# Patient Record
Sex: Female | Born: 1950 | Race: White | Hispanic: No | Marital: Married | Smoking: Never smoker
Health system: Southern US, Community
[De-identification: ages and names within clinical notes are randomized; demographics above are authoritative.]

## PROBLEM LIST (undated history)

## (undated) DIAGNOSIS — I341 Nonrheumatic mitral (valve) prolapse: Secondary | ICD-10-CM

## (undated) DIAGNOSIS — E7800 Pure hypercholesterolemia, unspecified: Secondary | ICD-10-CM

## (undated) DIAGNOSIS — K760 Fatty (change of) liver, not elsewhere classified: Secondary | ICD-10-CM

## (undated) DIAGNOSIS — L719 Rosacea, unspecified: Secondary | ICD-10-CM

## (undated) DIAGNOSIS — Z974 Presence of external hearing-aid: Secondary | ICD-10-CM

## (undated) DIAGNOSIS — D696 Thrombocytopenia, unspecified: Secondary | ICD-10-CM

## (undated) DIAGNOSIS — M24619 Ankylosis, unspecified shoulder: Secondary | ICD-10-CM

## (undated) DIAGNOSIS — C801 Malignant (primary) neoplasm, unspecified: Secondary | ICD-10-CM

## (undated) DIAGNOSIS — Z8719 Personal history of other diseases of the digestive system: Secondary | ICD-10-CM

## (undated) DIAGNOSIS — N6019 Diffuse cystic mastopathy of unspecified breast: Secondary | ICD-10-CM

## (undated) DIAGNOSIS — E538 Deficiency of other specified B group vitamins: Secondary | ICD-10-CM

## (undated) DIAGNOSIS — F329 Major depressive disorder, single episode, unspecified: Secondary | ICD-10-CM

## (undated) DIAGNOSIS — F32A Depression, unspecified: Secondary | ICD-10-CM

## (undated) DIAGNOSIS — K219 Gastro-esophageal reflux disease without esophagitis: Secondary | ICD-10-CM

## (undated) DIAGNOSIS — E559 Vitamin D deficiency, unspecified: Secondary | ICD-10-CM

## (undated) DIAGNOSIS — M810 Age-related osteoporosis without current pathological fracture: Secondary | ICD-10-CM

## (undated) HISTORY — DX: Nonrheumatic mitral (valve) prolapse: I34.1

## (undated) HISTORY — PX: COLONOSCOPY: SHX174

## (undated) HISTORY — DX: Gastro-esophageal reflux disease without esophagitis: K21.9

## (undated) HISTORY — PX: APPENDECTOMY: SHX54

## (undated) HISTORY — DX: Personal history of other diseases of the digestive system: Z87.19

## (undated) HISTORY — PX: ESOPHAGOGASTRODUODENOSCOPY: SHX1529

## (undated) HISTORY — DX: Depression, unspecified: F32.A

## (undated) HISTORY — DX: Ankylosis, unspecified shoulder: M24.619

## (undated) HISTORY — DX: Rosacea, unspecified: L71.9

## (undated) HISTORY — PX: HERNIA REPAIR: SHX51

## (undated) HISTORY — PX: SHOULDER SURGERY: SHX246

## (undated) HISTORY — DX: Fatty (change of) liver, not elsewhere classified: K76.0

## (undated) HISTORY — PX: ABDOMINAL HYSTERECTOMY: SHX81

## (undated) HISTORY — DX: Major depressive disorder, single episode, unspecified: F32.9

---

## 2005-02-19 DIAGNOSIS — F411 Generalized anxiety disorder: Secondary | ICD-10-CM | POA: Insufficient documentation

## 2007-01-02 ENCOUNTER — Ambulatory Visit: Payer: Self-pay | Admitting: Internal Medicine

## 2007-07-05 ENCOUNTER — Ambulatory Visit: Payer: Self-pay | Admitting: Obstetrics and Gynecology

## 2008-05-05 ENCOUNTER — Ambulatory Visit: Payer: Self-pay | Admitting: Internal Medicine

## 2009-08-28 ENCOUNTER — Ambulatory Visit: Payer: Self-pay

## 2009-09-01 ENCOUNTER — Ambulatory Visit: Payer: Self-pay

## 2009-10-12 ENCOUNTER — Ambulatory Visit: Payer: Self-pay | Admitting: Family Medicine

## 2010-09-17 ENCOUNTER — Ambulatory Visit: Payer: Self-pay | Admitting: Obstetrics and Gynecology

## 2011-12-15 ENCOUNTER — Ambulatory Visit: Payer: Self-pay | Admitting: Internal Medicine

## 2012-03-13 ENCOUNTER — Ambulatory Visit: Payer: Self-pay | Admitting: Unknown Physician Specialty

## 2012-03-15 LAB — PATHOLOGY REPORT

## 2012-12-15 ENCOUNTER — Ambulatory Visit: Payer: Self-pay | Admitting: Internal Medicine

## 2013-12-19 ENCOUNTER — Ambulatory Visit: Payer: Self-pay | Admitting: Internal Medicine

## 2014-06-11 DIAGNOSIS — E538 Deficiency of other specified B group vitamins: Secondary | ICD-10-CM | POA: Insufficient documentation

## 2014-06-11 DIAGNOSIS — E7800 Pure hypercholesterolemia, unspecified: Secondary | ICD-10-CM | POA: Insufficient documentation

## 2014-06-13 ENCOUNTER — Ambulatory Visit: Payer: Self-pay | Admitting: Internal Medicine

## 2014-07-24 DIAGNOSIS — K8020 Calculus of gallbladder without cholecystitis without obstruction: Secondary | ICD-10-CM | POA: Insufficient documentation

## 2014-07-24 DIAGNOSIS — K2100 Gastro-esophageal reflux disease with esophagitis, without bleeding: Secondary | ICD-10-CM | POA: Insufficient documentation

## 2014-07-24 HISTORY — DX: Gastro-esophageal reflux disease with esophagitis, without bleeding: K21.00

## 2014-08-06 ENCOUNTER — Ambulatory Visit: Payer: Self-pay | Admitting: Unknown Physician Specialty

## 2014-09-16 ENCOUNTER — Ambulatory Visit: Payer: Self-pay | Admitting: Unknown Physician Specialty

## 2014-10-02 ENCOUNTER — Ambulatory Visit: Payer: Self-pay | Admitting: Gastroenterology

## 2014-11-19 ENCOUNTER — Ambulatory Visit
Admit: 2014-11-19 | Disposition: A | Discharge status: Home / Self Care | Payer: Self-pay | Attending: Unknown Physician Specialty | Admitting: Unknown Physician Specialty

## 2014-12-09 LAB — SURGICAL PATHOLOGY

## 2014-12-12 ENCOUNTER — Ambulatory Visit: Admit: 2014-12-12 | Disposition: A | Discharge status: Home / Self Care | Payer: Self-pay | Admitting: Surgery

## 2014-12-12 ENCOUNTER — Ambulatory Visit: Payer: Self-pay

## 2014-12-16 ENCOUNTER — Other Ambulatory Visit: Payer: Self-pay | Admitting: Internal Medicine

## 2014-12-16 DIAGNOSIS — Z1231 Encounter for screening mammogram for malignant neoplasm of breast: Secondary | ICD-10-CM

## 2014-12-16 DIAGNOSIS — Z1239 Encounter for other screening for malignant neoplasm of breast: Secondary | ICD-10-CM

## 2014-12-19 ENCOUNTER — Inpatient Hospital Stay: Payer: BLUE CROSS/BLUE SHIELD | Admitting: Anesthesiology

## 2014-12-19 ENCOUNTER — Other Ambulatory Visit: Payer: Self-pay | Admitting: Surgery

## 2014-12-19 ENCOUNTER — Observation Stay
Admission: RE | Admit: 2014-12-19 | Discharge: 2014-12-21 | Disposition: A | Discharge status: Home / Self Care | Payer: BLUE CROSS/BLUE SHIELD | Source: Ambulatory Visit | Attending: Surgery | Admitting: Surgery

## 2014-12-19 ENCOUNTER — Encounter: Payer: Self-pay | Admitting: *Deleted

## 2014-12-19 ENCOUNTER — Encounter
Admission: RE | Disposition: A | Discharge status: Home / Self Care | Payer: Self-pay | Source: Ambulatory Visit | Attending: Surgery

## 2014-12-19 DIAGNOSIS — Z885 Allergy status to narcotic agent status: Secondary | ICD-10-CM | POA: Insufficient documentation

## 2014-12-19 DIAGNOSIS — Z90710 Acquired absence of both cervix and uterus: Secondary | ICD-10-CM | POA: Diagnosis not present

## 2014-12-19 DIAGNOSIS — Z833 Family history of diabetes mellitus: Secondary | ICD-10-CM | POA: Diagnosis not present

## 2014-12-19 DIAGNOSIS — Z801 Family history of malignant neoplasm of trachea, bronchus and lung: Secondary | ICD-10-CM | POA: Diagnosis not present

## 2014-12-19 DIAGNOSIS — N6019 Diffuse cystic mastopathy of unspecified breast: Secondary | ICD-10-CM | POA: Insufficient documentation

## 2014-12-19 DIAGNOSIS — Z880 Allergy status to penicillin: Secondary | ICD-10-CM | POA: Insufficient documentation

## 2014-12-19 DIAGNOSIS — I341 Nonrheumatic mitral (valve) prolapse: Secondary | ICD-10-CM | POA: Diagnosis not present

## 2014-12-19 DIAGNOSIS — Z8349 Family history of other endocrine, nutritional and metabolic diseases: Secondary | ICD-10-CM | POA: Insufficient documentation

## 2014-12-19 DIAGNOSIS — Z79899 Other long term (current) drug therapy: Secondary | ICD-10-CM | POA: Diagnosis not present

## 2014-12-19 DIAGNOSIS — Z883 Allergy status to other anti-infective agents status: Secondary | ICD-10-CM | POA: Insufficient documentation

## 2014-12-19 DIAGNOSIS — M810 Age-related osteoporosis without current pathological fracture: Secondary | ICD-10-CM | POA: Insufficient documentation

## 2014-12-19 DIAGNOSIS — E538 Deficiency of other specified B group vitamins: Secondary | ICD-10-CM | POA: Diagnosis not present

## 2014-12-19 DIAGNOSIS — Z8249 Family history of ischemic heart disease and other diseases of the circulatory system: Secondary | ICD-10-CM | POA: Insufficient documentation

## 2014-12-19 DIAGNOSIS — F329 Major depressive disorder, single episode, unspecified: Secondary | ICD-10-CM | POA: Insufficient documentation

## 2014-12-19 DIAGNOSIS — Z9889 Other specified postprocedural states: Secondary | ICD-10-CM | POA: Diagnosis not present

## 2014-12-19 DIAGNOSIS — E780 Pure hypercholesterolemia: Secondary | ICD-10-CM | POA: Diagnosis not present

## 2014-12-19 DIAGNOSIS — K449 Diaphragmatic hernia without obstruction or gangrene: Principal | ICD-10-CM | POA: Insufficient documentation

## 2014-12-19 DIAGNOSIS — K219 Gastro-esophageal reflux disease without esophagitis: Secondary | ICD-10-CM | POA: Diagnosis present

## 2014-12-19 DIAGNOSIS — K769 Liver disease, unspecified: Secondary | ICD-10-CM | POA: Diagnosis not present

## 2014-12-19 DIAGNOSIS — Z803 Family history of malignant neoplasm of breast: Secondary | ICD-10-CM | POA: Insufficient documentation

## 2014-12-19 HISTORY — PX: HIATAL HERNIA REPAIR: SHX195

## 2014-12-19 LAB — CREATININE, SERUM: Creatinine, Ser: 0.91 mg/dL (ref 0.44–1.00)

## 2014-12-19 LAB — CBC
HEMATOCRIT: 41.4 % (ref 35.0–47.0)
Hemoglobin: 13.9 g/dL (ref 12.0–16.0)
MCH: 32 pg (ref 26.0–34.0)
MCHC: 33.6 g/dL (ref 32.0–36.0)
MCV: 95.4 fL (ref 80.0–100.0)
Platelets: 140 10*3/uL — ABNORMAL LOW (ref 150–440)
RBC: 4.34 MIL/uL (ref 3.80–5.20)
RDW: 11.9 % (ref 11.5–14.5)
WBC: 11 10*3/uL (ref 3.6–11.0)

## 2014-12-19 SURGERY — REPAIR, HERNIA, HIATAL, LAPAROSCOPIC
Anesthesia: General

## 2014-12-19 MED ORDER — MIDAZOLAM HCL 2 MG/2ML IJ SOLN
INTRAMUSCULAR | Status: DC | PRN
  Administered 2014-12-19: 1 mg via INTRAVENOUS

## 2014-12-19 MED ORDER — HYDROCODONE-ACETAMINOPHEN 5-325 MG PO TABS
1.0000 | ORAL_TABLET | ORAL | Status: DC | PRN
  Administered 2014-12-19 – 2014-12-21 (×8): 2 via ORAL
  Filled 2014-12-19: qty 1
  Filled 2014-12-19 (×3): qty 2
  Filled 2014-12-19: qty 1
  Filled 2014-12-19 (×4): qty 2

## 2014-12-19 MED ORDER — PANTOPRAZOLE SODIUM 40 MG PO TBEC
40.0000 mg | DELAYED_RELEASE_TABLET | Freq: Two times a day (BID) | ORAL | Status: DC
  Administered 2014-12-19 – 2014-12-21 (×5): 40 mg via ORAL
  Filled 2014-12-19 (×5): qty 1

## 2014-12-19 MED ORDER — SUCCINYLCHOLINE CHLORIDE 20 MG/ML IJ SOLN
INTRAMUSCULAR | Status: DC | PRN
  Administered 2014-12-19: 90 mg via INTRAVENOUS

## 2014-12-19 MED ORDER — BUPIVACAINE-EPINEPHRINE (PF) 0.5% -1:200000 IJ SOLN
INTRAMUSCULAR | Status: AC
  Filled 2014-12-19: qty 30

## 2014-12-19 MED ORDER — ACETAMINOPHEN 10 MG/ML IV SOLN
INTRAVENOUS | Status: AC
  Administered 2014-12-19: 1000 mg via INTRAVENOUS
  Filled 2014-12-19: qty 100

## 2014-12-19 MED ORDER — HEPARIN SODIUM (PORCINE) 5000 UNIT/ML IJ SOLN
INTRAMUSCULAR | Status: AC
  Filled 2014-12-19: qty 1

## 2014-12-19 MED ORDER — DIPHENHYDRAMINE HCL 50 MG/ML IJ SOLN
INTRAMUSCULAR | Status: DC | PRN
  Administered 2014-12-19: 10 mg via INTRAVENOUS

## 2014-12-19 MED ORDER — ONDANSETRON HCL 4 MG/2ML IJ SOLN
INTRAMUSCULAR | Status: DC | PRN
  Administered 2014-12-19: 4 mg via INTRAVENOUS

## 2014-12-19 MED ORDER — DEXAMETHASONE SODIUM PHOSPHATE 4 MG/ML IJ SOLN
INTRAMUSCULAR | Status: DC | PRN
  Administered 2014-12-19: 5 mg via INTRAVENOUS

## 2014-12-19 MED ORDER — ROCURONIUM BROMIDE 100 MG/10ML IV SOLN
INTRAVENOUS | Status: DC | PRN
  Administered 2014-12-19 (×2): 10 mg via INTRAVENOUS
  Administered 2014-12-19: 40 mg via INTRAVENOUS
  Administered 2014-12-19 (×2): 10 mg via INTRAVENOUS

## 2014-12-19 MED ORDER — BUPIVACAINE-EPINEPHRINE 0.5% -1:200000 IJ SOLN
INTRAMUSCULAR | Status: DC | PRN
  Administered 2014-12-19: 23 mL

## 2014-12-19 MED ORDER — PROPOFOL 10 MG/ML IV BOLUS
INTRAVENOUS | Status: DC | PRN
  Administered 2014-12-19: 20 mg via INTRAVENOUS
  Administered 2014-12-19: 140 mg via INTRAVENOUS

## 2014-12-19 MED ORDER — LIDOCAINE HCL (CARDIAC) 20 MG/ML IV SOLN
INTRAVENOUS | Status: DC | PRN
  Administered 2014-12-19: 100 mg via INTRAVENOUS

## 2014-12-19 MED ORDER — FENTANYL CITRATE (PF) 100 MCG/2ML IJ SOLN
INTRAMUSCULAR | Status: DC | PRN
  Administered 2014-12-19: 100 ug via INTRAVENOUS
  Administered 2014-12-19 (×2): 50 ug via INTRAVENOUS
  Administered 2014-12-19 (×2): 100 ug via INTRAVENOUS
  Administered 2014-12-19 (×2): 50 ug via INTRAVENOUS

## 2014-12-19 MED ORDER — HYDROMORPHONE HCL 1 MG/ML IJ SOLN
INTRAMUSCULAR | Status: DC | PRN
  Administered 2014-12-19: 1 mg via INTRAVENOUS

## 2014-12-19 MED ORDER — PHENYLEPHRINE HCL 10 MG/ML IJ SOLN
INTRAMUSCULAR | Status: DC | PRN
  Administered 2014-12-19 (×2): 150 ug via INTRAVENOUS

## 2014-12-19 MED ORDER — CEFAZOLIN SODIUM 1-5 GM-% IV SOLN
INTRAVENOUS | Status: DC | PRN
  Administered 2014-12-19: .99 g via INTRAVENOUS
  Administered 2014-12-19: .01 g via INTRAVENOUS

## 2014-12-19 MED ORDER — NEOSTIGMINE METHYLSULFATE 10 MG/10ML IV SOLN
INTRAVENOUS | Status: DC | PRN
  Administered 2014-12-19: 4 mg via INTRAVENOUS

## 2014-12-19 MED ORDER — FENTANYL CITRATE (PF) 100 MCG/2ML IJ SOLN: 25.0000 ug | INTRAMUSCULAR | Status: DC | PRN

## 2014-12-19 MED ORDER — HEPARIN SODIUM (PORCINE) 5000 UNIT/ML IJ SOLN
5000.0000 [IU] | Freq: Two times a day (BID) | INTRAMUSCULAR | Status: DC
  Administered 2014-12-19 – 2014-12-21 (×4): 5000 [IU] via SUBCUTANEOUS
  Filled 2014-12-19 (×4): qty 1

## 2014-12-19 MED ORDER — GLYCOPYRROLATE 0.2 MG/ML IJ SOLN
INTRAMUSCULAR | Status: DC | PRN
  Administered 2014-12-19: 0.6 mg via INTRAVENOUS

## 2014-12-19 MED ORDER — ONDANSETRON HCL 4 MG/2ML IJ SOLN
4.0000 mg | Freq: Four times a day (QID) | INTRAMUSCULAR | Status: DC | PRN
  Administered 2014-12-19: 4 mg via INTRAVENOUS
  Filled 2014-12-19: qty 2

## 2014-12-19 MED ORDER — LACTATED RINGERS IV SOLN
INTRAVENOUS | Status: DC
  Administered 2014-12-19 (×4): via INTRAVENOUS

## 2014-12-19 MED ORDER — ONDANSETRON HCL 4 MG/2ML IJ SOLN
4.0000 mg | Freq: Once | INTRAMUSCULAR | Status: DC | PRN
Start: 2014-12-19 — End: 2014-12-19

## 2014-12-19 MED ORDER — DEXTROSE-NACL 5-0.2 % IV SOLN
INTRAVENOUS | Status: DC
  Administered 2014-12-19 – 2014-12-20 (×3): via INTRAVENOUS

## 2014-12-19 MED ORDER — MORPHINE SULFATE 2 MG/ML IJ SOLN
1.0000 mg | INTRAMUSCULAR | Status: DC | PRN
  Administered 2014-12-19: 2 mg via INTRAVENOUS
  Filled 2014-12-19: qty 1

## 2014-12-19 SURGICAL SUPPLY — 45 items
APL SKNCLS STERI-STRIP NONHPOA (GAUZE/BANDAGES/DRESSINGS) ×1
APPLIER CLIP ROT 10 11.4 M/L (STAPLE)
APR CLP MED LRG 11.4X10 (STAPLE)
BENZOIN TINCTURE PRP APPL 2/3 (GAUZE/BANDAGES/DRESSINGS) ×2 IMPLANT
CANISTER SUCT 1200ML W/VALVE (MISCELLANEOUS) ×3 IMPLANT
CANNULA DILATOR 10 W/SLV (CANNULA) ×2 IMPLANT
CANNULA DILATOR 10MM W/SLV (CANNULA) ×1
CATH TRAY 16F METER LATEX (MISCELLANEOUS) ×1 IMPLANT
CHLORAPREP W/TINT 26ML (MISCELLANEOUS) ×3 IMPLANT
CLIP APPLIE ROT 10 11.4 M/L (STAPLE) ×1 IMPLANT
CLOSURE WOUND 1/2 X4 (GAUZE/BANDAGES/DRESSINGS) ×1
DEVICE SUTURE ENDOST 10MM (ENDOMECHANICALS) ×2 IMPLANT
DISSECTOR KITTNER STICK (MISCELLANEOUS) ×2 IMPLANT
DISSECTORS/KITTNER STICK (MISCELLANEOUS) ×6
GAUZE SPONGE 4X4 12PLY STRL (GAUZE/BANDAGES/DRESSINGS) ×3 IMPLANT
GLOVE BIO SURGEON STRL SZ7.5 (GLOVE) ×11 IMPLANT
GOWN STRL REUS W/ TWL LRG LVL3 (GOWN DISPOSABLE) ×2 IMPLANT
GOWN STRL REUS W/TWL LRG LVL3 (GOWN DISPOSABLE) ×9
IRRIGATION STRYKERFLOW (MISCELLANEOUS) ×1 IMPLANT
IRRIGATOR STRYKERFLOW (MISCELLANEOUS) ×3
IV NS 1000ML (IV SOLUTION)
IV NS 1000ML BAXH (IV SOLUTION) ×1 IMPLANT
KIT RM TURNOVER STRD PROC AR (KITS) ×3 IMPLANT
LABEL OR SOLS (LABEL) ×1 IMPLANT
LIQUID BAND (GAUZE/BANDAGES/DRESSINGS) ×1 IMPLANT
NDL HYPO 25X1 1.5 SAFETY (NEEDLE) IMPLANT
NDL INSUFF ACCESS 14 VERSASTEP (NEEDLE) ×3 IMPLANT
NEEDLE HYPO 25X1 1.5 SAFETY (NEEDLE) ×3 IMPLANT
NS IRRIG 500ML POUR BTL (IV SOLUTION) ×3 IMPLANT
PACK LAP CHOLECYSTECTOMY (MISCELLANEOUS) ×3 IMPLANT
PAD GROUND ADULT SPLIT (MISCELLANEOUS) ×3 IMPLANT
RETRACT II ENDO 10MM 32CML (ENDOMECHANICALS) ×3
RETRACTOR II ENDO 10MM 32CML (ENDOMECHANICALS) ×1 IMPLANT
SCISSORS METZENBAUM CVD 33 (INSTRUMENTS) ×3 IMPLANT
SEAL FOR SCOPE WARMER C3101 (MISCELLANEOUS) ×1 IMPLANT
SHEARS HARMONIC ACE PLUS 36CM (ENDOMECHANICALS) ×3 IMPLANT
STRIP CLOSURE SKIN 1/2X4 (GAUZE/BANDAGES/DRESSINGS) ×2 IMPLANT
SUT CHROMIC 4 0 RB 1X27 (SUTURE) ×5 IMPLANT
SUT ENDO SURGIDAC 0 7  GRN ES9 (SUTURE) ×22
SUT ENDO SURGIDAC 0 7 GRN ES9 (SUTURE) ×11
SUTURE ENDO SURGDC 0 7 GRN ES9 (SUTURE) ×8 IMPLANT
TROCAR XCEL NON-BLD 11X100MML (ENDOMECHANICALS) ×3 IMPLANT
TROCAR XCEL UNIV SLVE 11M 100M (ENDOMECHANICALS) ×12 IMPLANT
TUBING INSUFFLATOR HEATED (MISCELLANEOUS) ×3 IMPLANT
WATER STERILE IRR 1000ML POUR (IV SOLUTION) ×1 IMPLANT

## 2014-12-19 NOTE — Transfer of Care (Signed)
Immediate Anesthesia Transfer of Care Note  Patient: Jody Allen  Procedure(s) Performed: Procedure(s): LAPAROSCOPIC REPAIR OF HIATAL HERNIA (N/A)  Patient Location: PACU  Anesthesia Type:General  Level of Consciousness: awake, alert  and oriented  Airway & Oxygen Therapy: Patient Spontanous Breathing and Patient connected to nasal cannula oxygen  Post-op Assessment: Report given to RN and Post -op Vital signs reviewed and stable  Post vital signs: Reviewed and stable  Last Vitals:  Filed Vitals:   12/19/14 0820  BP: 148/57  Pulse: 73  Temp: 36.9 C  Resp: 14    Complications: No apparent anesthesia complications

## 2014-12-19 NOTE — Anesthesia Postprocedure Evaluation (Signed)
  Anesthesia Post-op Note  Patient: Jody Allen  Procedure(s) Performed: Procedure(s): LAPAROSCOPIC REPAIR OF HIATAL HERNIA (N/A)  Anesthesia type:General  Patient location: PACU  Post pain: Pain level controlled  Post assessment: Post-op Vital signs reviewed, Patient's Cardiovascular Status Stable, Respiratory Function Stable, Patent Airway and No signs of Nausea or vomiting  Post vital signs: Reviewed and stable  Last Vitals:  Filed Vitals:   12/19/14 1338  BP:   Pulse:   Temp: 36.4 C  Resp:     Level of consciousness: awake, alert  and patient cooperative  Complications: No apparent anesthesia complications

## 2014-12-19 NOTE — Progress Notes (Signed)
She reports moderate epigastric discomfort after surgery. She has taken 2 Norco tablets. Been drinking some water. She has walked to the bathroom and passed a small amount of urine.  Vital signs are stable. She is awake alert and oriented. Dressings are dry. Minimal abdominal tenderness.  I discussed the operation and plan of care.

## 2014-12-19 NOTE — Anesthesia Preprocedure Evaluation (Signed)
Anesthesia Evaluation  Patient identified by MRN, date of birth, ID band Patient awake    History of Anesthesia Complications Negative for: history of anesthetic complications  Airway Mallampati: III       Dental  (+) Caps   Pulmonary neg pulmonary ROS,    Pulmonary exam normal       Cardiovascular Normal cardiovascular exam    Neuro/Psych Depression negative neurological ROS     GI/Hepatic hiatal hernia, GERD-  ,  Endo/Other  negative endocrine ROS  Renal/GU negative Renal ROS  negative genitourinary   Musculoskeletal negative musculoskeletal ROS (+)   Abdominal Normal abdominal exam  (+)   Peds negative pediatric ROS (+)  Hematology negative hematology ROS (+)   Anesthesia Other Findings   Reproductive/Obstetrics negative OB ROS                             Anesthesia Physical Anesthesia Plan  ASA: II  Anesthesia Plan: General   Post-op Pain Management:    Induction: Intravenous  Airway Management Planned: Oral ETT  Additional Equipment:   Intra-op Plan:   Post-operative Plan: Extubation in OR  Informed Consent: I have reviewed the patients History and Physical, chart, labs and discussed the procedure including the risks, benefits and alternatives for the proposed anesthesia with the patient or authorized representative who has indicated his/her understanding and acceptance.     Plan Discussed with: CRNA and Surgeon  Anesthesia Plan Comments:         Anesthesia Quick Evaluation

## 2014-12-19 NOTE — Op Note (Signed)
Preoperative diagnosis hiatus hernia with chronic gastroesophageal reflux  Postoperative diagnosis same  Procedure laparoscopic hiatus hernia repair with fundoplication  Anesthesia Gen.  Surgeon Rochel Brome  Indications this 64 year old female has a 10 year history of chronic gastroesophageal reflux and has not made satisfactory progress with medical management and surgery was recommended for definitive treatment.  The patient was placed on the operating table in the supine position under general endotracheal anesthesia. The abdomen was prepared with ChloraPrep and draped in a sterile manner  A short incision was made in the epigastrium just about 1 inch above the umbilicus and carried down to the deep fascia which was grasped with a laryngeal hook. A Veress needle was inserted aspirated and irrigated with saline solution. Abdomen was insufflated with carbon dioxide. The Veress needle was removed. The 12 mm cannula was inserted. The 10 mm 25 laparoscope was inserted to view the peritoneal cavity. There was an appearance of a fatty liver. There was some mild distention of the stomach and I had the anesthetist insert an oral gastric tube for drainage of the stomach. An 11 mm trocar was inserted in the subxiphoid position. The next trocar was inserted in the left upper quadrant. The next trocar was inserted in the right upper quadrant just below the costal margin and another midway between that and the camera site. The sixth trocar was inserted in the right upper quadrant at the anterior axillary line.  With the patient in the reverse Trendelenburg position and the 5 finger fan retractor was introduced through the subxiphoid port to retract the left lobe of the liver and held in place with the Buchwalter mechanical arm. The stomach was grasped with a Babcock clamp and traction applied. The gastrohepatic ligament was incised with the harmonic scalpel. Dissection of the peritoneum over the esophagus was  carried out with the Harmonic scalpel. The right crus of the diagram was identified. With blunt dissection the left crus was identified. Blunt dissection was carried out posterior to the esophagus to create a window which was some 3 cm in dimension which could be triangulated. The hiatus hernia repaired was carried out with a roll of 0 Surgidac sutures using the Endo Stitch instrument. there was some minimal bleeding which was aspirated and resolved. It appeared that the repair may have been her that needed and therefore 2 sutures were removed and replaced with a single 0 Surgidac figure-of-eight suture A floppy portion of the stomach was selected for the fundoplication and was passed from left to right posterior to the esophagus with a Babcock clamp. The fundoplication was carried out with 0 Surgidac sutures placing 4 sutures. The wrap was satisfactorily floppy in that it was not tight as planned. Hemostasis appeared to be intact  The laparoscopic cannulas were removed. There was some bleeding from the right upper quadrant port site in the midclavicular line and this site was infiltrated with half percent Sensorcaine with epinephrine. Other incisions were infiltrated as well also subcutaneous tissues were infiltrated. We appeared to be intact and all the cannulas were removed. The skin incisions were closed with interrupted 4-0 chromic subcuticular suture benzoin and Steri-Strips. dressings were applied with paper tape

## 2014-12-19 NOTE — H&P (Signed)
  She had recent bronchitis and took antibiotic and is better.  Lungs sound clear.  No other change in condition.  12-19-2014

## 2014-12-19 NOTE — Anesthesia Procedure Notes (Signed)
Date/Time: 12/19/2014 10:09 AM Performed by: Rosaria Ferries, Tacori Kvamme Oxygen Delivery Method: Circle system utilized Preoxygenation: Pre-oxygenation with 100% oxygen Intubation Type: IV induction Laryngoscope Size: Mac and 3 Grade View: Grade I Tube type: Oral Tube size: 7.0 mm Number of attempts: 1 (thyroid pressure rerquired ) Placement Confirmation: ETT inserted through vocal cords under direct vision and breath sounds checked- equal and bilateral Secured at: 21 cm Tube secured with: Tape Dental Injury: Teeth and Oropharynx as per pre-operative assessment

## 2014-12-19 NOTE — OR Nursing (Signed)
The patient verified she is allergic to penicillin, but Dr. Tamala Julian ordered 1gm of Kefzol to be given before the start of the case.

## 2014-12-20 DIAGNOSIS — K449 Diaphragmatic hernia without obstruction or gangrene: Secondary | ICD-10-CM | POA: Diagnosis not present

## 2014-12-20 MED ORDER — DIAZEPAM 5 MG PO TABS
5.0000 mg | ORAL_TABLET | Freq: Three times a day (TID) | ORAL | Status: DC | PRN
  Administered 2014-12-20: 5 mg via ORAL
  Filled 2014-12-20: qty 1

## 2014-12-20 MED ORDER — ACETAMINOPHEN 325 MG PO TABS: 325.0000 mg | ORAL_TABLET | ORAL | Status: DC | PRN

## 2014-12-20 NOTE — Plan of Care (Signed)
Problem: Discharge Progression Outcomes Goal: Complications resolved/controlled Pt has 6 lap sites with dressing c/d/i. C/o pain x2 thus far this shift. Up to br independently. Calls for assistance as needed. No nausea reported, ice chips only. Asleep in between care.

## 2014-12-20 NOTE — Progress Notes (Signed)
Patient ID: Jody Allen, female   DOB: 13-Apr-1951, 64 y.o.   MRN: [id number] She had some postoperative pain last night which is much better this morning. She has been sipping on clear liquids and ice chips. She is having no nausea. She is walking to the bathroom and emptying her bladder satisfactorily.  She is awake alert and oriented. Lung sounds are clear. Dressings are dry. Abdomen is soft with minimal tenderness.  Plan is to start clear liquids today. Ambulate in the hallway

## 2014-12-20 NOTE — Progress Notes (Signed)
She has been taking clear liquids today and walking in the hallway. Just about a hour ago she began to develop some spasm-type pains in the right upper quadrant. Vital signs are stable. There is moderate tenderness in the right upper quadrant. Dressings are dry.  Impression possible muscle spasms  Plan is to prescribe Valium for muscle spasms

## 2014-12-21 DIAGNOSIS — K449 Diaphragmatic hernia without obstruction or gangrene: Secondary | ICD-10-CM | POA: Diagnosis not present

## 2014-12-21 MED ORDER — DOCUSATE SODIUM 100 MG PO CAPS
100.0000 mg | ORAL_CAPSULE | Freq: Two times a day (BID) | ORAL | Status: DC
Start: 2014-12-21 — End: 2015-11-16

## 2014-12-21 MED ORDER — HYDROCODONE-ACETAMINOPHEN 5-325 MG PO TABS: 1.0000 | ORAL_TABLET | ORAL | Status: DC | PRN

## 2014-12-21 MED ORDER — PANTOPRAZOLE SODIUM 40 MG PO TBEC: 40.0000 mg | DELAYED_RELEASE_TABLET | Freq: Two times a day (BID) | ORAL | Status: DC

## 2014-12-21 NOTE — Progress Notes (Signed)
She reports improvement today. She had spasm-like pain in the right upper abdomen yesterday intake of Valium and the spasms appeared to resolve. He has continued to have some moderate discomfort on the right side. She reports she has taken full liquids. Has had some minimal burping. She has been walking in the hallway. Has been taking Norco for pain.  Vital signs stable. She is awake alert and oriented. Dressings were removed. Wounds appear to be healing satisfactorily and Steri-Strips remain intact. Abdomen with moderate right upper quadrant tenderness.  Plan is to continue with full liquids and convert IV to saline lock. Possible discharge later today.

## 2014-12-21 NOTE — Discharge Summary (Signed)
Diagnosis hiatus hernia with chronic gastroesophageal reflux  Procedure laparoscopic hiatus hernia repair with fundoplication  This 64 year old female has a 10 year history of chronic gastroesophageal reflux with persistent symptoms despite treatment.  Details are recorded on the typed H&P  She came in through the outpatient surgery department and was carried to the operating room and had laparoscopic hiatus hernia repair and Nissen fundoplication postoperatively she was kept in the hospital for a period of observation. She was given IV fluids and Norco. She was also treated with subcutaneous heparin.  She did have some moderate pain in the right mid abdomen. She had some muscle spasms which were treated with Valium. This is gradually improved.  She was begun on a clear liquid diet and advanced to full liquid diet which he has tolerated fairly well.  Discharge instructions were given. Plan to discontinue Protonix after 1 week. Take stool softener 2 per day and decrease as appropriate  Follow-up in the office

## 2014-12-24 ENCOUNTER — Encounter: Payer: Self-pay | Admitting: Surgery

## 2014-12-26 ENCOUNTER — Ambulatory Visit: Payer: Medicare Other

## 2015-01-02 ENCOUNTER — Ambulatory Visit: Payer: Medicare Other

## 2015-01-16 ENCOUNTER — Ambulatory Visit
Admission: RE | Admit: 2015-01-16 | Discharge: 2015-01-16 | Disposition: A | Discharge status: Home / Self Care | Payer: BLUE CROSS/BLUE SHIELD | Source: Ambulatory Visit | Attending: Internal Medicine | Admitting: Internal Medicine

## 2015-01-16 DIAGNOSIS — Z1231 Encounter for screening mammogram for malignant neoplasm of breast: Secondary | ICD-10-CM | POA: Diagnosis present

## 2015-01-17 ENCOUNTER — Other Ambulatory Visit: Payer: Self-pay | Admitting: Internal Medicine

## 2015-01-17 DIAGNOSIS — R928 Other abnormal and inconclusive findings on diagnostic imaging of breast: Secondary | ICD-10-CM

## 2015-01-17 DIAGNOSIS — N6489 Other specified disorders of breast: Secondary | ICD-10-CM

## 2015-01-24 ENCOUNTER — Ambulatory Visit
Admission: RE | Admit: 2015-01-24 | Discharge: 2015-01-24 | Disposition: A | Discharge status: Home / Self Care | Payer: BLUE CROSS/BLUE SHIELD | Source: Ambulatory Visit | Attending: Internal Medicine | Admitting: Internal Medicine

## 2015-01-24 ENCOUNTER — Ambulatory Visit: Payer: Medicare Other

## 2015-01-24 DIAGNOSIS — N6489 Other specified disorders of breast: Secondary | ICD-10-CM | POA: Diagnosis present

## 2015-01-24 DIAGNOSIS — R928 Other abnormal and inconclusive findings on diagnostic imaging of breast: Secondary | ICD-10-CM

## 2015-07-30 ENCOUNTER — Other Ambulatory Visit: Payer: Self-pay | Admitting: Obstetrics and Gynecology

## 2015-07-30 DIAGNOSIS — Z1239 Encounter for other screening for malignant neoplasm of breast: Secondary | ICD-10-CM

## 2015-08-17 HISTORY — PX: OTHER SURGICAL HISTORY: SHX169

## 2015-11-16 ENCOUNTER — Observation Stay
Admission: EM | Admit: 2015-11-16 | Discharge: 2015-11-18 | Disposition: A | Discharge status: Home / Self Care | Payer: BLUE CROSS/BLUE SHIELD | Attending: Internal Medicine | Admitting: Internal Medicine

## 2015-11-16 ENCOUNTER — Emergency Department: Payer: BLUE CROSS/BLUE SHIELD

## 2015-11-16 ENCOUNTER — Encounter: Payer: Self-pay | Admitting: Emergency Medicine

## 2015-11-16 DIAGNOSIS — Z803 Family history of malignant neoplasm of breast: Secondary | ICD-10-CM | POA: Insufficient documentation

## 2015-11-16 DIAGNOSIS — Z86010 Personal history of colonic polyps: Secondary | ICD-10-CM | POA: Insufficient documentation

## 2015-11-16 DIAGNOSIS — K219 Gastro-esophageal reflux disease without esophagitis: Secondary | ICD-10-CM | POA: Insufficient documentation

## 2015-11-16 DIAGNOSIS — Z880 Allergy status to penicillin: Secondary | ICD-10-CM | POA: Diagnosis not present

## 2015-11-16 DIAGNOSIS — Z9889 Other specified postprocedural states: Secondary | ICD-10-CM | POA: Diagnosis not present

## 2015-11-16 DIAGNOSIS — K760 Fatty (change of) liver, not elsewhere classified: Secondary | ICD-10-CM | POA: Insufficient documentation

## 2015-11-16 DIAGNOSIS — K5730 Diverticulosis of large intestine without perforation or abscess without bleeding: Secondary | ICD-10-CM | POA: Insufficient documentation

## 2015-11-16 DIAGNOSIS — R197 Diarrhea, unspecified: Secondary | ICD-10-CM | POA: Insufficient documentation

## 2015-11-16 DIAGNOSIS — Z90710 Acquired absence of both cervix and uterus: Secondary | ICD-10-CM | POA: Diagnosis not present

## 2015-11-16 DIAGNOSIS — F329 Major depressive disorder, single episode, unspecified: Secondary | ICD-10-CM | POA: Insufficient documentation

## 2015-11-16 DIAGNOSIS — M24611 Ankylosis, right shoulder: Secondary | ICD-10-CM | POA: Insufficient documentation

## 2015-11-16 DIAGNOSIS — I341 Nonrheumatic mitral (valve) prolapse: Secondary | ICD-10-CM | POA: Diagnosis not present

## 2015-11-16 DIAGNOSIS — Z79899 Other long term (current) drug therapy: Secondary | ICD-10-CM | POA: Insufficient documentation

## 2015-11-16 DIAGNOSIS — R109 Unspecified abdominal pain: Principal | ICD-10-CM | POA: Insufficient documentation

## 2015-11-16 DIAGNOSIS — Z885 Allergy status to narcotic agent status: Secondary | ICD-10-CM | POA: Insufficient documentation

## 2015-11-16 DIAGNOSIS — D7389 Other diseases of spleen: Secondary | ICD-10-CM | POA: Insufficient documentation

## 2015-11-16 DIAGNOSIS — K640 First degree hemorrhoids: Secondary | ICD-10-CM | POA: Diagnosis not present

## 2015-11-16 DIAGNOSIS — Z79891 Long term (current) use of opiate analgesic: Secondary | ICD-10-CM | POA: Diagnosis not present

## 2015-11-16 DIAGNOSIS — Z883 Allergy status to other anti-infective agents status: Secondary | ICD-10-CM | POA: Diagnosis not present

## 2015-11-16 DIAGNOSIS — L719 Rosacea, unspecified: Secondary | ICD-10-CM | POA: Diagnosis not present

## 2015-11-16 DIAGNOSIS — D125 Benign neoplasm of sigmoid colon: Secondary | ICD-10-CM | POA: Diagnosis not present

## 2015-11-16 DIAGNOSIS — K625 Hemorrhage of anus and rectum: Secondary | ICD-10-CM | POA: Diagnosis present

## 2015-11-16 DIAGNOSIS — K922 Gastrointestinal hemorrhage, unspecified: Secondary | ICD-10-CM

## 2015-11-16 DIAGNOSIS — E876 Hypokalemia: Secondary | ICD-10-CM | POA: Insufficient documentation

## 2015-11-16 DIAGNOSIS — Z888 Allergy status to other drugs, medicaments and biological substances status: Secondary | ICD-10-CM | POA: Insufficient documentation

## 2015-11-16 DIAGNOSIS — Z9049 Acquired absence of other specified parts of digestive tract: Secondary | ICD-10-CM | POA: Insufficient documentation

## 2015-11-16 LAB — CBC
HCT: 46 % (ref 35.0–47.0)
HEMATOCRIT: 41 % (ref 35.0–47.0)
HEMOGLOBIN: 14.2 g/dL (ref 12.0–16.0)
Hemoglobin: 15.7 g/dL (ref 12.0–16.0)
MCH: 32.4 pg (ref 26.0–34.0)
MCH: 33 pg (ref 26.0–34.0)
MCHC: 34.2 g/dL (ref 32.0–36.0)
MCHC: 34.6 g/dL (ref 32.0–36.0)
MCV: 94.9 fL (ref 80.0–100.0)
MCV: 95.4 fL (ref 80.0–100.0)
PLATELETS: 137 10*3/uL — AB (ref 150–440)
Platelets: 124 10*3/uL — ABNORMAL LOW (ref 150–440)
RBC: 4.84 MIL/uL (ref 3.80–5.20)
RBC: 4.3 MIL/uL (ref 3.80–5.20)
RDW: 13.3 % (ref 11.5–14.5)
RDW: 12.7 % (ref 11.5–14.5)
WBC: 5.9 10*3/uL (ref 3.6–11.0)
WBC: 6 10*3/uL (ref 3.6–11.0)

## 2015-11-16 LAB — APTT: aPTT: 27 seconds (ref 24–36)

## 2015-11-16 LAB — BASIC METABOLIC PANEL
Anion gap: 4 — ABNORMAL LOW (ref 5–15)
BUN: 14 mg/dL (ref 6–20)
CALCIUM: 9.1 mg/dL (ref 8.9–10.3)
CO2: 28 mmol/L (ref 22–32)
Chloride: 106 mmol/L (ref 101–111)
Creatinine, Ser: 0.87 mg/dL (ref 0.44–1.00)
GFR calc Af Amer: >60 mL/min (ref >60)
GLUCOSE: 86 mg/dL (ref 65–99)
Potassium: 4 mmol/L (ref 3.5–5.1)
Sodium: 138 mmol/L (ref 135–145)

## 2015-11-16 LAB — PROTIME-INR
INR: 0.98
Prothrombin Time: 13.2 seconds (ref 11.4–15.0)

## 2015-11-16 LAB — TYPE AND SCREEN
ABO/RH(D): A POS
Antibody Screen: NEGATIVE

## 2015-11-16 LAB — ABO/RH: ABO/RH(D): A POS

## 2015-11-16 MED ORDER — ACETAMINOPHEN 650 MG RE SUPP: 650.0000 mg | Freq: Four times a day (QID) | RECTAL | Status: DC | PRN

## 2015-11-16 MED ORDER — DOCUSATE SODIUM 100 MG PO CAPS: 100.0000 mg | ORAL_CAPSULE | Freq: Two times a day (BID) | ORAL | Status: DC

## 2015-11-16 MED ORDER — VITAMIN D (ERGOCALCIFEROL) 1.25 MG (50000 UNIT) PO CAPS
50000.0000 [IU] | ORAL_CAPSULE | ORAL | Status: DC
  Administered 2015-11-17: 50000 [IU] via ORAL
  Filled 2015-11-16: qty 1

## 2015-11-16 MED ORDER — ACETAMINOPHEN 325 MG PO TABS: 650.0000 mg | ORAL_TABLET | Freq: Four times a day (QID) | ORAL | Status: DC | PRN

## 2015-11-16 MED ORDER — ONDANSETRON HCL 4 MG/2ML IJ SOLN: 4.0000 mg | Freq: Four times a day (QID) | INTRAMUSCULAR | Status: DC | PRN

## 2015-11-16 MED ORDER — HYDROCODONE-ACETAMINOPHEN 5-325 MG PO TABS
1.0000 | ORAL_TABLET | ORAL | Status: DC | PRN
Start: 2015-11-16 — End: 2015-11-18

## 2015-11-16 MED ORDER — BISACODYL 10 MG RE SUPP: 10.0000 mg | Freq: Every day | RECTAL | Status: DC | PRN

## 2015-11-16 MED ORDER — METRONIDAZOLE 0.75 % EX CREA
1.0000 "application " | TOPICAL_CREAM | Freq: Two times a day (BID) | CUTANEOUS | Status: DC
  Administered 2015-11-16 – 2015-11-18 (×4): 1 via TOPICAL
  Filled 2015-11-16: qty 45

## 2015-11-16 MED ORDER — SODIUM CHLORIDE 0.9 % IV SOLN
INTRAVENOUS | Status: DC
  Administered 2015-11-16 – 2015-11-18 (×5): via INTRAVENOUS

## 2015-11-16 MED ORDER — DIATRIZOATE MEGLUMINE & SODIUM 66-10 % PO SOLN
15.0000 mL | Freq: Once | ORAL | Status: AC
  Administered 2015-11-16: 15 mL via ORAL

## 2015-11-16 MED ORDER — ESTRADIOL 0.05 MG/24HR TD PTWK
0.0500 mg | MEDICATED_PATCH | TRANSDERMAL | Status: DC
  Filled 2015-11-16 (×2): qty 1

## 2015-11-16 MED ORDER — VITAMIN B-12 1000 MCG PO TABS
1000.0000 ug | ORAL_TABLET | Freq: Every day | ORAL | Status: DC
  Administered 2015-11-16 – 2015-11-18 (×3): 1000 ug via ORAL
  Filled 2015-11-16 (×3): qty 1

## 2015-11-16 MED ORDER — SODIUM CHLORIDE 0.9 % IV BOLUS (SEPSIS)
1000.0000 mL | Freq: Once | INTRAVENOUS | Status: AC
  Administered 2015-11-16: 1000 mL via INTRAVENOUS

## 2015-11-16 MED ORDER — IOPAMIDOL (ISOVUE-300) INJECTION 61%
100.0000 mL | Freq: Once | INTRAVENOUS | Status: AC | PRN
  Administered 2015-11-16: 100 mL via INTRAVENOUS

## 2015-11-16 MED ORDER — ONDANSETRON HCL 4 MG PO TABS: 4.0000 mg | ORAL_TABLET | Freq: Four times a day (QID) | ORAL | Status: DC | PRN

## 2015-11-16 MED ORDER — PANTOPRAZOLE SODIUM 40 MG IV SOLR
40.0000 mg | Freq: Two times a day (BID) | INTRAVENOUS | Status: DC
  Administered 2015-11-16 – 2015-11-18 (×5): 40 mg via INTRAVENOUS
  Filled 2015-11-16 (×5): qty 40

## 2015-11-16 NOTE — ED Provider Notes (Signed)
[facility] Emergency Department Provider Note  ____________________________________________  Time seen: Approximately 12:54 PM  I have reviewed the triage vital signs and the nursing notes.   HISTORY  Chief Complaint Rectal Bleeding    HPI ELNORIA SCHLEISMAN is a 65 y.o. female with a history of hemorrhoids, mitral valve prolapse, GERD, not anticoagulated, presenting with bright red blood per rectum. Patient reports that for the past 3 weeks she has had a significant, greater than usual, amount of flatus associated with mild abdominal distention. Yesterday she had several loose stools which was unusual for her. She woke up at 3 AM and had multiple bouts of watery stool with a significant amount of blood, making the entire toilet bowl red. She has had some central lower abdominal cramping but no fever, chills, nausea or vomiting. She has had no lightheadedness or shortness of breath, chest pain.   Past Medical History  Diagnosis Date  . GERD (gastroesophageal reflux disease)   . History of hiatal hernia   . Depression     history of ect  . Rosacea, acne   . MVP (mitral valve prolapse)   . Fatty liver disease, nonalcoholic   . Shoulder ankylosis     right    Patient Active Problem List   Diagnosis Date Noted  . Gastroesophageal reflux 12/19/2014    Past Surgical History  Procedure Laterality Date  . Abdominal hysterectomy    . Appendectomy    . Hiatal hernia repair N/A 12/19/2014    Procedure: LAPAROSCOPIC REPAIR OF HIATAL HERNIA;  Surgeon: Rochel Brome, MD;  Location: [facility] ORS;  Service: General;  Laterality: N/A;    Current Outpatient Rx  Name  Route  Sig  Dispense  Refill  . docusate sodium (COLACE) 100 MG capsule   Oral   Take 1 capsule (100 mg total) by mouth 2 (two) times daily.   30 capsule   0     Decreas number of tablets taken as needed   . doxycycline (VIBRAMYCIN) 50 MG capsule   Oral   Take 50 mg by mouth daily.         Marland Kitchen  estradiol (VIVELLE-DOT) 0.05 MG/24HR patch   Transdermal   Place 1 patch onto the skin once a week.         Marland Kitchen HYDROcodone-acetaminophen (NORCO/VICODIN) 5-325 MG per tablet   Oral   Take 1-2 tablets by mouth every 4 (four) hours as needed for moderate pain.   30 tablet   0   . metroNIDAZOLE (METROCREAM) 0.75 % cream   Topical   Apply 1 application topically daily as needed (infection).         . pantoprazole (PROTONIX) 40 MG tablet   Oral   Take 1 tablet (40 mg total) by mouth 2 (two) times daily.   1 tablet   0     Dispense as written.    Discontinue 1 week after surgery.   . vitamin B-12 (CYANOCOBALAMIN) 1000 MCG tablet   Oral   Take 1,000 mcg by mouth daily.         . Vitamin D, Ergocalciferol, (DRISDOL) 50000 UNITS CAPS capsule   Oral   Take 50,000 Units by mouth every 7 (seven) days.           Allergies Compazine; Flagyl; Morphine and related; and Penicillins  Family History  Problem Relation Age of Onset  . Breast cancer Maternal Aunt 38  . Breast cancer Paternal Aunt 46  . Breast cancer Maternal  Grandmother 71  . Breast cancer Cousin 9    Social History Social History  Substance Use Topics  . Smoking status: Never Smoker   . Smokeless tobacco: Never Used  . Alcohol Use: No    Review of Systems Constitutional: No fever/chills. No lightheadedness or syncope. Eyes: No visual changes. ENT: No sore throat. No congestion or rhinorrhea. Cardiovascular: Denies chest pain. Denies palpitations. Respiratory: Denies shortness of breath.  No cough. Gastrointestinal: Positive lower abdominal pain.  No nausea, no vomiting.  Positive diarrhea. Positive bright red blood per rectum. No constipation. Genitourinary: Negative for dysuria. Musculoskeletal: Negative for back pain. Skin: Negative for rash. Neurological: Negative for headaches. No focal numbness, tingling or weakness.   10-point ROS otherwise  negative.  ____________________________________________   PHYSICAL EXAM:  VITAL SIGNS: ED Triage Vitals  Enc Vitals Group     BP 11/16/15 1047 176/76 mmHg     Pulse Rate 11/16/15 1047 75     Resp 11/16/15 1047 17     Temp 11/16/15 1047 98.7 F (37.1 C)     Temp Source 11/16/15 1047 Oral     SpO2 11/16/15 1047 98 %     Weight 11/16/15 1047 140 lb (63.504 kg)     Height 11/16/15 1047 5\' 5"  (1.651 m)     Head Cir --      Peak Flow --      Pain Score 11/16/15 1048 2     Pain Loc --      Pain Edu? --      Excl. in [place]? --     Constitutional: Alert and oriented. Well appearing and in no acute distress. Answers questions appropriately. Eyes: Conjunctivae are normal.  EOMI. No scleral icterus. No conjunctival pallor. Head: Atraumatic. Nose: No congestion/rhinnorhea. Mouth/Throat: Mucous membranes are moist.  Neck: No stridor.  Supple.   Cardiovascular: Normal rate, regular rhythm. No murmurs, rubs or gallops.  Respiratory: Normal respiratory effort.  No accessory muscle use or retractions. Lungs CTAB.  No wheezes, rales or ronchi. Gastrointestinal: Abdomen is soft. She has mild tenderness to palpation in the suprapubic area. Mild distention.  No guarding or rebound.  No peritoneal signs. GI: Small nonthrombosed nonbleeding external hemorrhoids with no palpable internal hemorrhoids. No tenderness to palpation with rectal exam. Mucus and blood returned with rectal exam. Positive Hemoccult. Musculoskeletal: No LE edema. No ttp in the calves or palpable cords.  Negative Homan's sign. Neurologic:  A&Ox3.  Speech is clear.  Face and smile are symmetric.  EOMI.  Moves all extremities well. Skin:  Skin is warm, dry and intact. No rash noted. No pallor. Psychiatric: Mood and affect are normal. Speech and behavior are normal.  Normal judgement.  ____________________________________________   LABS (all labs ordered are listed, but only abnormal results are displayed)  Labs Reviewed  CBC -  Abnormal; Notable for the following:    Platelets 137 (*)    All other components within normal limits  BASIC METABOLIC PANEL - Abnormal; Notable for the following:    Anion gap 4 (*)    All other components within normal limits  APTT  PROTIME-INR  TYPE AND SCREEN  ABO/RH   ____________________________________________  EKG  ED ECG REPORT I, Eula Listen, the attending physician, personally viewed and interpreted this ECG.   Date: 11/16/2015  EKG Time: 1118  Rate: 62  Rhythm: normal sinus rhythm  Axis: Normal  Intervals:none  ST&T Change: Nonspecific T-wave inversion in V1. No ST elevation.  ____________________________________________  RADIOLOGY  Ct  Abdomen Pelvis W Contrast  11/16/2015  CLINICAL DATA:  Rectal bleeding since 0300 hours today, generalized lower abdominal cramping, diarrhea, prior Nissen fundoplication, hysterectomy and appendectomy, GERD, nonalcoholic fatty liver disease EXAM: CT ABDOMEN AND PELVIS WITH CONTRAST TECHNIQUE: Multidetector CT imaging of the abdomen and pelvis was performed using the standard protocol following bolus administration of intravenous contrast. Sagittal and coronal MPR images reconstructed from axial data set. CONTRAST:  177mL ISOVUE-300 IOPAMIDOL (ISOVUE-300) INJECTION 61% IV. Dilute oral contrast. COMPARISON:  None FINDINGS: Lower chest:  Minimal subsegmental atelectasis at lung bases. Hepatobiliary: Fatty infiltration of liver. Liver and gallbladder otherwise unremarkable without biliary dilatation. Pancreas: Normal appearance Spleen: 10 mm low-attenuation focus laterally image 21 which fills in on delayed images. Adrenals/Urinary Tract: Normal appearing adrenal glands. Kidneys, ureters and bladder normal appearance. Stomach/Bowel: Appendix surgically absent by history. Tiny hiatal hernia. Stomach and bowel loops otherwise normal appearance. No source of GI bleeding identified. Vascular/Lymphatic: Scattered atherosclerotic  calcifications without aneurysm. No intra-abdominal or intrapelvic adenopathy. Reproductive: Surgical absence of uterus with normal sized ovaries bilaterally. Other: No mass, free air or free fluid. Musculoskeletal: Bones demineralized. IMPRESSION: Fatty infiltration of liver. No definite acute intra-abdominal or intrapelvic abnormalities. 10 mm splenic lesion question atypical hemangioma Electronically Signed   By: Lavonia Dana M.D.   On: 11/16/2015 14:14    ____________________________________________   PROCEDURES  Procedure(s) performed: no  Critical Care performed: No ____________________________________________   INITIAL IMPRESSION / ASSESSMENT AND PLAN / ED COURSE  Pertinent labs & imaging results that were available during my care of the patient were reviewed by me and considered in my medical decision making (see chart for details).  65 y.o. female presenting with bright red blood per rectum but no signs or symptoms consistent with hypovolemia. Overall, the patient is well-appearing without pallor or unstable vital signs. She does have blood on her rectal exam. I would consider malignancy, infection including diverticulitis, or hemorrhoids is the most likely causes for her bleeding. We'll get a CT scan of her abdomen, basic labs, and reevaluate the patient after her diagnostic workup is complete.  ----------------------------------------- 2:36 PM on 11/16/2015 -----------------------------------------  Patient continues to have stable vital signs and her blood counts have returned normal. Her CT scan does not show the cause of her bleeding. We'll plan to admit her to the hospital in stable condition.  ____________________________________________  FINAL CLINICAL IMPRESSION(S) / ED DIAGNOSES  Final diagnoses:  Bright red blood per rectum      NEW MEDICATIONS STARTED DURING THIS VISIT:  New Prescriptions   No medications on file     Eula Listen, MD 11/16/15  1436

## 2015-11-16 NOTE — Progress Notes (Signed)
Up to bathroom, passing bright red blood, no stool present. Lauris Poag, RN 11/16/15 [phone]

## 2015-11-16 NOTE — Consult Note (Signed)
GI Inpatient Consult Note  Reason for Consult:LGI bleeding and diarrhea   Attending Requesting Consult:Dr. Sparks  History of Present Illness: Jody Allen is a 65 y.o. female who had onset of 6-7 watery stools which awoke her at 3am this morning.  Stools later had blood in them.  She had crampy abd pain with this, gas like pain.  A week ago she had abd distention with peristalsis.  She did vist and had coffee with a friend 2 weeks ago who had previous C. Diff infection a few months ago.  It has been 3 1/2 hours since her last passage.  She has no prior hx of C. Diff colitis.  Past Medical History:  Past Medical History  Diagnosis Date  . GERD (gastroesophageal reflux disease)   . History of hiatal hernia   . Depression     history of ect  . Rosacea, acne   . MVP (mitral valve prolapse)   . Fatty liver disease, nonalcoholic   . Shoulder ankylosis     right    Problem List: Patient Active Problem List   Diagnosis Date Noted  . LGI bleed 11/16/2015  . Abdominal cramping 11/16/2015  . Acute diarrhea 11/16/2015  . History of colon polyps 11/16/2015  . Gastroesophageal reflux 12/19/2014    Past Surgical History: Past Surgical History  Procedure Laterality Date  . Abdominal hysterectomy    . Appendectomy    . Hiatal hernia repair N/A 12/19/2014    Procedure: LAPAROSCOPIC REPAIR OF HIATAL HERNIA;  Surgeon: Rochel Brome, MD;  Location: [facility] ORS;  Service: General;  Laterality: N/A;  . Shoulder surgery      Allergies: Allergies  Allergen Reactions  . Phenothiazines     Other reaction(s): Other (See Comments) Other Reaction: TORTICOLLIS (MUSCLE SPASM IN T  . Compazine [Prochlorperazine Edisylate] Other (See Comments)    Twists head,locks muscles  . Flagyl [Metronidazole] Nausea And Vomiting  . Morphine And Related Nausea And Vomiting  . Penicillins Hives and Rash    Has patient had a PCN reaction causing immediate rash, facial/tongue/throat swelling, SOB or lightheadedness  with hypotension: Yes Has patient had a PCN reaction causing severe rash involving mucus membranes or skin necrosis: No Has patient had a PCN reaction that required hospitalization No Has patient had a PCN reaction occurring within the last 10 years: No If all of the above answers are "NO", then may proceed with Cephalosporin use.    Home Medications: Prescriptions prior to admission  Medication Sig Dispense Refill Last Dose  . cyanocobalamin (,VITAMIN B-12,) 1000 MCG/ML injection Inject 1 mL into the muscle every 30 (thirty) days.   10/20/2015  . doxycycline (VIBRAMYCIN) 50 MG capsule Take 50 mg by mouth daily.   11/15/2015 at Unknown time  . estradiol (VIVELLE-DOT) 0.05 MG/24HR patch Place 1 patch onto the skin once a week. Put on Mondays.   11/16/2015 at Unknown time  . metroNIDAZOLE (METROCREAM) 0.75 % cream Apply 1 application topically 2 (two) times daily.    11/16/2015 at Unknown time  . pantoprazole (PROTONIX) 40 MG tablet Take 1 tablet (40 mg total) by mouth 2 (two) times daily. 1 tablet 0 11/16/2015 at Unknown time  . Vitamin D, Ergocalciferol, (DRISDOL) 50000 UNITS CAPS capsule Take 50,000 Units by mouth every 7 (seven) days. Take on Mondays.   11/10/2015   Home medication reconciliation was completed with the patient.   Scheduled Inpatient Medications:   . [START ON 11/17/2015] estradiol  0.05 mg Transdermal Weekly  .  metroNIDAZOLE  1 application Topical BID  . pantoprazole (PROTONIX) IV  40 mg Intravenous Q12H  . vitamin B-12  1,000 mcg Oral Daily  . [START ON 11/17/2015] Vitamin D (Ergocalciferol)  50,000 Units Oral Q7 days    Continuous Inpatient Infusions:   . sodium chloride 100 mL/hr at 11/16/15 1504    PRN Inpatient Medications:  acetaminophen **OR** acetaminophen, HYDROcodone-acetaminophen, ondansetron **OR** ondansetron (ZOFRAN) IV  Family History: family history includes Breast cancer (age of onset: 49) in her cousin; Breast cancer (age of onset: 68) in her maternal aunt;  Breast cancer (age of onset: 58) in her paternal aunt; Breast cancer (age of onset: 16) in her maternal grandmother.  The patient's family history is negative for inflammatory bowel disorders, GI malignancy, or solid organ transplantation.  Social History:   reports that she has never smoked. She has never used smokeless tobacco. She reports that she drinks about 0.6 oz of alcohol per week. She reports that she does not use illicit drugs. The patient denies ETOH, tobacco, or drug use.   Review of Systems: Constitutional: Weight is stable.  Eyes: No changes in vision. ENT: No oral lesions, sore throat.  GI: see HPI.  Heme/Lymph: No easy bruising.  CV: No chest pain.  GU: No hematuria.  Integumentary: No rashes.  Neuro: had some headaches.  Psych: No depression/anxiety.  Endocrine: No heat/cold intolerance.  Allergic/Immunologic: No urticaria.  Resp: No cough, SOB.  Musculoskeletal: No joint swelling.    Physical Examination: BP 147/66 mmHg  Pulse 64  Temp(Src) 97.9 F (36.6 C) (Oral)  Resp 19  Ht 5\' 5"  (1.651 m)  Wt 63.912 kg (140 lb 14.4 oz)  BMI 23.45 kg/m2  SpO2 98% Gen: NAD, alert and oriented x 4 HEENT: , EOMI, Neck: supple, no JVD or thyromegaly Chest: CTA bilaterally, no wheezes, crackles, or other adventitious sounds CV: RRR, no m/g/c/r Abd: soft, NT, ND, +BS in all four quadrants; no HSM, guarding, ridigity, or rebound tenderness Ext: no edema, well perfused with 2+ pulses, Skin: no rash or lesions noted Lymph: no LAD  Data: Lab Results  Component Value Date   WBC 5.9 11/16/2015   HGB 15.7 11/16/2015   HCT 46.0 11/16/2015   MCV 94.9 11/16/2015   PLT 137* 11/16/2015    Recent Labs Lab 11/16/15 1229  HGB 15.7   Lab Results  Component Value Date   NA 138 11/16/2015   K 4.0 11/16/2015   CL 106 11/16/2015   CO2 28 11/16/2015   BUN 14 11/16/2015   CREATININE 0.87 11/16/2015   No results found for: ALT, AST, GGT, ALKPHOS, BILITOT  Recent Labs Lab  11/16/15 1229  APTT 27  INR 0.98   Assessment/Plan: Ms. Jody Allen is a 65 y.o. female with new onset of diarrhea which after a few movements had some BRB in it.  Last colonoscopy was 2013.  She had no evidence of diverticulosis, No abd pain at this time and only minimal tenderness, no fever, no elevated WBC.  Family does have well water but spouse is not ill.She is on Doxycycline for Rosacea.  C. Diff is a possibility since she may have been exposed and she is on chronic antibiotic.  Will order stool for C. Diff and comprehensive panel.  I doubt she has ischemic colitis given absence of signif abd pain.  Will put in isolation until stool studies returned.  Will follow with you  Recommendations:  Thank you for the consult. Please call with questions or concerns.  Gaylyn Cheers, MD

## 2015-11-16 NOTE — ED Notes (Signed)
Pt reports rectal bleeding that started today at 3am - Blood in stool is bright red and large amount per pt - Pt reports a history of hemorrhoids - She reports diarrhea for the last 24 hours - Pt denies nausea and vomiting - Pt reports headache but has not eaten today

## 2015-11-16 NOTE — H&P (Signed)
History and Physical    Jody Allen [id number] DOB: 1951-02-08 DOA: 11/16/2015  Referring physician: Dr. Mariea Clonts PCP: Adin Hector, MD  Specialists: Dr. Vira Agar  Chief Complaint: diarrhea with abdominal cramping and BRBPR  HPI: Jody Allen is a 65 y.o. female has a past medical history significant for GERD, Rosacea, and hiatal hernia s/p surgical repair now with 1-2 day hx of abdominal cramping with diarrhea and recurrent episodes of BRBPR. No N/V, hematemesis, or melena. No fever or rash. VSS in ER. Hgb stable. CT of abd/pelvis non-diagnostic. Stool guaiac positive in ER. She is now admitted. Denies CP or SOB.  Review of Systems: The patient denies anorexia, fever, weight loss,, vision loss, decreased hearing, hoarseness, chest pain, syncope, dyspnea on exertion, peripheral edema, balance deficits, hemoptysis, melena,  severe indigestion/heartburn, hematuria, incontinence, genital sores, muscle weakness, suspicious skin lesions, transient blindness, difficulty walking, depression, unusual weight change, abnormal bleeding, enlarged lymph nodes, angioedema, and breast masses.   Past Medical History  Diagnosis Date  . GERD (gastroesophageal reflux disease)   . History of hiatal hernia   . Depression     history of ect  . Rosacea, acne   . MVP (mitral valve prolapse)   . Fatty liver disease, nonalcoholic   . Shoulder ankylosis     right   Past Surgical History  Procedure Laterality Date  . Abdominal hysterectomy    . Appendectomy    . Hiatal hernia repair N/A 12/19/2014    Procedure: LAPAROSCOPIC REPAIR OF HIATAL HERNIA;  Surgeon: Rochel Brome, MD;  Location: [facility] ORS;  Service: General;  Laterality: N/A;   Social History:  reports that she has never smoked. She has never used smokeless tobacco. She reports that she does not drink alcohol or use illicit drugs.  Allergies  Allergen Reactions  . Compazine [Prochlorperazine Edisylate] Other (See Comments)    Twists  head,locks muscles  . Flagyl [Metronidazole] Nausea And Vomiting  . Morphine And Related Nausea And Vomiting  . Penicillins Hives    Family History  Problem Relation Age of Onset  . Breast cancer Maternal Aunt 38  . Breast cancer Paternal Aunt 50  . Breast cancer Maternal Grandmother 49  . Breast cancer Cousin 32    Prior to Admission medications   Medication Sig Start Date End Date Taking? Authorizing Provider  docusate sodium (COLACE) 100 MG capsule Take 1 capsule (100 mg total) by mouth 2 (two) times daily. 12/21/14   Leonie Green, MD  doxycycline (VIBRAMYCIN) 50 MG capsule Take 50 mg by mouth daily.    Historical Provider, MD  estradiol (VIVELLE-DOT) 0.05 MG/24HR patch Place 1 patch onto the skin once a week.    Historical Provider, MD  HYDROcodone-acetaminophen (NORCO/VICODIN) 5-325 MG per tablet Take 1-2 tablets by mouth every 4 (four) hours as needed for moderate pain. 12/21/14   Leonie Green, MD  metroNIDAZOLE (METROCREAM) 0.75 % cream Apply 1 application topically daily as needed (infection).    Historical Provider, MD  pantoprazole (PROTONIX) 40 MG tablet Take 1 tablet (40 mg total) by mouth 2 (two) times daily. 12/21/14   Leonie Green, MD  vitamin B-12 (CYANOCOBALAMIN) 1000 MCG tablet Take 1,000 mcg by mouth daily.    Historical Provider, MD  Vitamin D, Ergocalciferol, (DRISDOL) 50000 UNITS CAPS capsule Take 50,000 Units by mouth every 7 (seven) days.    Historical Provider, MD   Physical Exam: Filed Vitals:   11/16/15 1109 11/16/15 1145 11/16/15 1200 11/16/15 1230  BP: 151/70  132/60   Pulse: 65 56 64 74  Temp: 97.5 F (36.4 C)     TempSrc: Oral     Resp: 16 17 13 23   Height:      Weight:      SpO2: 97% 98% 98% 95%     General:  No apparent distress, WDWN, [place]/AT  Eyes: PERRL, EOMI, no scleral icterus, conjunctiva clear  ENT: moist oropharynx without lesions or exudate. Dentition good. TM's benign  Neck: supple, no lymphadenopathy> No bruits or  thyromegaly  Cardiovascular: regular rate without MRG; 2+ peripheral pulses, no JVD, no peripheral edema  Respiratory: CTA biL, good air movement without wheezing, rhonchi or crackled, respiratory effort normal  Abdomen: soft, non tender to palpation, positive bowel sounds, no guarding, no rebound, no organomegaly  Skin: no rashes or lesions,   Musculoskeletal: normal bulk and tone, no joint swelling  Psychiatric: normal mood and affect, A&OX3  Neurologic: CN 2-12 grossly intact, Motor strength 5/5 in all 4 groups with normal sensory exam and symmetric DTR's  Labs on Admission:  Basic Metabolic Panel:  Recent Labs Lab 11/16/15 1229  NA 138  K 4.0  CL 106  CO2 28  GLUCOSE 86  BUN 14  CREATININE 0.87  CALCIUM 9.1   Liver Function Tests: No results for input(s): AST, ALT, ALKPHOS, BILITOT, PROT, ALBUMIN in the last 168 hours. No results for input(s): LIPASE, AMYLASE in the last 168 hours. No results for input(s): AMMONIA in the last 168 hours. CBC:  Recent Labs Lab 11/16/15 1229  WBC 5.9  HGB 15.7  HCT 46.0  MCV 94.9  PLT 137*   Cardiac Enzymes: No results for input(s): CKTOTAL, CKMB, CKMBINDEX, TROPONINI in the last 168 hours.  BNP (last 3 results) No results for input(s): BNP in the last 8760 hours.  ProBNP (last 3 results) No results for input(s): PROBNP in the last 8760 hours.  CBG: No results for input(s): GLUCAP in the last 168 hours.  Radiological Exams on Admission: Ct Abdomen Pelvis W Contrast  11/16/2015  CLINICAL DATA:  Rectal bleeding since 0300 hours today, generalized lower abdominal cramping, diarrhea, prior Nissen fundoplication, hysterectomy and appendectomy, GERD, nonalcoholic fatty liver disease EXAM: CT ABDOMEN AND PELVIS WITH CONTRAST TECHNIQUE: Multidetector CT imaging of the abdomen and pelvis was performed using the standard protocol following bolus administration of intravenous contrast. Sagittal and coronal MPR images reconstructed  from axial data set. CONTRAST:  148mL ISOVUE-300 IOPAMIDOL (ISOVUE-300) INJECTION 61% IV. Dilute oral contrast. COMPARISON:  None FINDINGS: Lower chest:  Minimal subsegmental atelectasis at lung bases. Hepatobiliary: Fatty infiltration of liver. Liver and gallbladder otherwise unremarkable without biliary dilatation. Pancreas: Normal appearance Spleen: 10 mm low-attenuation focus laterally image 21 which fills in on delayed images. Adrenals/Urinary Tract: Normal appearing adrenal glands. Kidneys, ureters and bladder normal appearance. Stomach/Bowel: Appendix surgically absent by history. Tiny hiatal hernia. Stomach and bowel loops otherwise normal appearance. No source of GI bleeding identified. Vascular/Lymphatic: Scattered atherosclerotic calcifications without aneurysm. No intra-abdominal or intrapelvic adenopathy. Reproductive: Surgical absence of uterus with normal sized ovaries bilaterally. Other: No mass, free air or free fluid. Musculoskeletal: Bones demineralized. IMPRESSION: Fatty infiltration of liver. No definite acute intra-abdominal or intrapelvic abnormalities. 10 mm splenic lesion question atypical hemangioma Electronically Signed   By: Lavonia Dana M.D.   On: 11/16/2015 14:14    EKG: Independently reviewed.  Assessment/Plan Active Problems:   LGI bleed   Abdominal cramping   Acute diarrhea   History of colon polyps  Will observe on floor with IV fluids and monitor hgb closely. Clear liquid diet. Consult GI for possible colonoscopy. No indication for ABX at present  Diet: clear liquids Fluids: NS@100  DVT Prophylaxis: none  Code Status: FULL  Family Communication: yes  Disposition Plan: home  Time spent: 45 min

## 2015-11-16 NOTE — ED Notes (Signed)
Pt reports bright red rectal bleeding since 3-4 am.  Has passed blood even without bowel movement.  Reports "gas pains".  Denies vomiting.  C/o headache.

## 2015-11-16 NOTE — ED Notes (Signed)
I attempted to draw labs X2 - Emma RN attempted to draw labs X2 - Charge Greg RN notified and then lab notified and requested to come and draw labs  (rainbow and type & screen)

## 2015-11-17 DIAGNOSIS — R109 Unspecified abdominal pain: Secondary | ICD-10-CM | POA: Diagnosis not present

## 2015-11-17 LAB — CBC
HCT: 41.1 % (ref 35.0–47.0)
HEMOGLOBIN: 14 g/dL (ref 12.0–16.0)
MCH: 32.4 pg (ref 26.0–34.0)
MCHC: 34.1 g/dL (ref 32.0–36.0)
MCV: 95.1 fL (ref 80.0–100.0)
PLATELETS: 121 10*3/uL — AB (ref 150–440)
RBC: 4.33 MIL/uL (ref 3.80–5.20)
RDW: 13.2 % (ref 11.5–14.5)
WBC: 4.6 10*3/uL (ref 3.6–11.0)

## 2015-11-17 LAB — COMPREHENSIVE METABOLIC PANEL
ALK PHOS: 43 U/L (ref 38–126)
ALT: 42 U/L (ref 14–54)
ANION GAP: 1 — AB (ref 5–15)
AST: 31 U/L (ref 15–41)
Albumin: 3.5 g/dL (ref 3.5–5.0)
BUN: 8 mg/dL (ref 6–20)
CALCIUM: 8.4 mg/dL — AB (ref 8.9–10.3)
CO2: 25 mmol/L (ref 22–32)
Chloride: 113 mmol/L — ABNORMAL HIGH (ref 101–111)
Creatinine, Ser: 0.78 mg/dL (ref 0.44–1.00)
GFR calc non Af Amer: >60 mL/min (ref >60)
Glucose, Bld: 108 mg/dL — ABNORMAL HIGH (ref 65–99)
Potassium: 3.4 mmol/L — ABNORMAL LOW (ref 3.5–5.1)
SODIUM: 139 mmol/L (ref 135–145)
Total Bilirubin: 1 mg/dL (ref 0.3–1.2)
Total Protein: 5.9 g/dL — ABNORMAL LOW (ref 6.5–8.1)

## 2015-11-17 NOTE — Consult Note (Signed)
Patient has not had a loose or bloody stool since I did a consult at  6pm yesterday.  This makes it very unlikely she has C. Diff or infectious colitis.  Plan to advance to full liquids and do flex sig tomorrow.

## 2015-11-17 NOTE — Progress Notes (Signed)
[place] [facility] [place] NAME: Jody Allen    MR#:  [id number]  DATE OF BIRTH:  1951-01-13  SUBJECTIVE:  CHIEF COMPLAINT:   Chief Complaint  Patient presents with  . Rectal Bleeding  feels fine. No further bleeding or BM, husband at bedside  REVIEW OF SYSTEMS:  Review of Systems  Constitutional: Negative for fever, weight loss, malaise/fatigue and diaphoresis.  HENT: Negative for ear discharge, ear pain, hearing loss, nosebleeds, sore throat and tinnitus.   Eyes: Negative for blurred vision and pain.  Respiratory: Negative for cough, hemoptysis, shortness of breath and wheezing.   Cardiovascular: Negative for chest pain, palpitations, orthopnea and leg swelling.  Gastrointestinal: Positive for diarrhea and blood in stool. Negative for heartburn, nausea, vomiting, abdominal pain and constipation.  Genitourinary: Negative for dysuria, urgency and frequency.  Musculoskeletal: Negative for myalgias and back pain.  Skin: Negative for itching and rash.  Neurological: Negative for dizziness, tingling, tremors, focal weakness, seizures, weakness and headaches.  Psychiatric/Behavioral: Negative for depression. The patient is not nervous/anxious.    DRUG ALLERGIES:   Allergies  Allergen Reactions  . Phenothiazines     Other reaction(s): Other (See Comments) Other Reaction: TORTICOLLIS (MUSCLE SPASM IN T  . Compazine [Prochlorperazine Edisylate] Other (See Comments)    Twists head,locks muscles  . Flagyl [Metronidazole] Nausea And Vomiting  . Morphine And Related Nausea And Vomiting  . Penicillins Hives and Rash    Has patient had a PCN reaction causing immediate rash, facial/tongue/throat swelling, SOB or lightheadedness with hypotension: Yes Has patient had a PCN reaction causing severe rash involving mucus membranes or skin necrosis: No Has patient had a PCN reaction that required hospitalization No Has patient had a PCN reaction  occurring within the last 10 years: No If all of the above answers are "NO", then may proceed with Cephalosporin use.   VITALS:  Blood pressure 120/62, pulse 60, temperature 97.8 F (36.6 C), temperature source Oral, resp. rate 19, height 5\' 5"  (1.651 m), weight 64.774 kg (142 lb 12.8 oz), SpO2 95 %. PHYSICAL EXAMINATION:  Physical Exam  Constitutional: She is oriented to person, place, and time and well-developed, well-nourished, and in no distress.  HENT:  Head: Normocephalic and atraumatic.  Eyes: Conjunctivae and EOM are normal. Pupils are equal, round, and reactive to light.  Neck: Normal range of motion. Neck supple. No tracheal deviation present. No thyromegaly present.  Cardiovascular: Normal rate, regular rhythm and normal heart sounds.   Pulmonary/Chest: Effort normal and breath sounds normal. No respiratory distress. She has no wheezes. She exhibits no tenderness.  Abdominal: Soft. Bowel sounds are normal. She exhibits no distension. There is no tenderness.  Musculoskeletal: Normal range of motion.  Neurological: She is alert and oriented to person, place, and time. No cranial nerve deficit.  Skin: Skin is warm and dry. No rash noted.  Psychiatric: Mood and affect normal.   LABORATORY PANEL:   CBC  Recent Labs Lab 11/17/15 0555  WBC 4.6  HGB 14.0  HCT 41.1  PLT 121*   ------------------------------------------------------------------------------------------------------------------ Chemistries   Recent Labs Lab 11/17/15 0555  NA 139  K 3.4*  CL 113*  CO2 25  GLUCOSE 108*  BUN 8  CREATININE 0.78  CALCIUM 8.4*  AST 31  ALT 42  ALKPHOS 43  BILITOT 1.0   RADIOLOGY:  No results found. ASSESSMENT AND PLAN:  * LGI bleed: passed some clotted blood y'day, none while in the Hospital * Abdominal cramping: resolved *  Acute diarrhea: no BM in the Hospital * History of colon polyps: plan for flex sig tomorrow per GI., * hypokalemia: replete and  recheck    All the records are reviewed and case discussed with Care Management/Social Worker. Management plans discussed with the patient, family and they are in agreement.  CODE STATUS: FULL CODE  TOTAL TIME TAKING CARE OF THIS PATIENT: 25 minutes.   More than 50% of the time was spent in counseling/coordination of care: YES  POSSIBLE D/C IN 1-2 DAYS, DEPENDING ON CLINICAL CONDITION. And GI w/up   Max Sane M.D on 11/17/2015 at 5:04 PM  Between 7am to 6pm - Pager - [phone]  After 6pm go to [email or web address] - password [email or web address] [facility]  Office  [phone]  CC: Primary care physician; Adin Hector, MD  Note: This dictation was prepared with Dragon dictation along with smaller phrase technology. Any transcriptional errors that result from this process are unintentional.

## 2015-11-18 ENCOUNTER — Encounter: Payer: Self-pay | Admitting: *Deleted

## 2015-11-18 ENCOUNTER — Encounter
Admission: EM | Disposition: A | Discharge status: Home / Self Care | Payer: Self-pay | Source: Home / Self Care | Attending: Emergency Medicine

## 2015-11-18 DIAGNOSIS — R109 Unspecified abdominal pain: Secondary | ICD-10-CM | POA: Diagnosis not present

## 2015-11-18 HISTORY — PX: FLEXIBLE SIGMOIDOSCOPY: SHX5431

## 2015-11-18 LAB — GASTROINTESTINAL PANEL BY PCR, STOOL (REPLACES STOOL CULTURE)
ASTROVIRUS: NOT DETECTED
Adenovirus F40/41: NOT DETECTED
Campylobacter species: NOT DETECTED
Cryptosporidium: NOT DETECTED
Cyclospora cayetanensis: NOT DETECTED
E. COLI O157: NOT DETECTED
ENTEROAGGREGATIVE E COLI (EAEC): NOT DETECTED
ENTEROPATHOGENIC E COLI (EPEC): NOT DETECTED
Entamoeba histolytica: NOT DETECTED
Enterotoxigenic E coli (ETEC): NOT DETECTED
Giardia lamblia: NOT DETECTED
NOROVIRUS GI/GII: NOT DETECTED
Plesimonas shigelloides: NOT DETECTED
ROTAVIRUS A: NOT DETECTED
SALMONELLA SPECIES: NOT DETECTED
SHIGA LIKE TOXIN PRODUCING E COLI (STEC): NOT DETECTED
SHIGELLA/ENTEROINVASIVE E COLI (EIEC): NOT DETECTED
Sapovirus (I, II, IV, and V): NOT DETECTED
VIBRIO CHOLERAE: NOT DETECTED
VIBRIO SPECIES: NOT DETECTED
YERSINIA ENTEROCOLITICA: NOT DETECTED

## 2015-11-18 LAB — CBC
HEMATOCRIT: 39.6 % (ref 35.0–47.0)
Hemoglobin: 13.7 g/dL (ref 12.0–16.0)
MCH: 33.1 pg (ref 26.0–34.0)
MCHC: 34.7 g/dL (ref 32.0–36.0)
MCV: 95.5 fL (ref 80.0–100.0)
PLATELETS: 118 10*3/uL — AB (ref 150–440)
RBC: 4.15 MIL/uL (ref 3.80–5.20)
RDW: 12.8 % (ref 11.5–14.5)
WBC: 4.3 10*3/uL (ref 3.6–11.0)

## 2015-11-18 LAB — BASIC METABOLIC PANEL
Anion gap: 3 — ABNORMAL LOW (ref 5–15)
BUN: 6 mg/dL (ref 6–20)
CALCIUM: 8.2 mg/dL — AB (ref 8.9–10.3)
CO2: 22 mmol/L (ref 22–32)
CREATININE: 0.83 mg/dL (ref 0.44–1.00)
Chloride: 114 mmol/L — ABNORMAL HIGH (ref 101–111)
GFR calc Af Amer: >60 mL/min (ref >60)
GLUCOSE: 100 mg/dL — AB (ref 65–99)
Potassium: 3.3 mmol/L — ABNORMAL LOW (ref 3.5–5.1)
Sodium: 139 mmol/L (ref 135–145)

## 2015-11-18 SURGERY — SIGMOIDOSCOPY, FLEXIBLE
Anesthesia: Moderate Sedation

## 2015-11-18 MED ORDER — ONDANSETRON HCL 4 MG/2ML IJ SOLN
INTRAMUSCULAR | Status: DC | PRN
  Administered 2015-11-18: 4 mg via INTRAVENOUS

## 2015-11-18 MED ORDER — FENTANYL CITRATE (PF) 100 MCG/2ML IJ SOLN
INTRAMUSCULAR | Status: DC | PRN
  Administered 2015-11-18 (×3): 25 ug via INTRAVENOUS

## 2015-11-18 MED ORDER — MIDAZOLAM HCL 5 MG/5ML IJ SOLN
INTRAMUSCULAR | Status: DC | PRN
  Administered 2015-11-18 (×2): 2 mg via INTRAVENOUS

## 2015-11-18 NOTE — Progress Notes (Signed)
11/18/2015  17:15  Jody Allen to be D/C'd Home per MD order.  Discussed prescriptions and follow up appointments with the patient. Prescriptions given to patient, medication list explained in detail. Pt verbalized understanding.    Medication List    TAKE these medications        cyanocobalamin 1000 MCG/ML injection  Commonly known as:  (VITAMIN B-12)  Inject 1 mL into the muscle every 30 (thirty) days.     doxycycline 50 MG capsule  Commonly known as:  VIBRAMYCIN  Take 50 mg by mouth daily.     estradiol 0.05 MG/24HR patch  Commonly known as:  VIVELLE-DOT  Place 1 patch onto the skin once a week. Put on Mondays.     metroNIDAZOLE 0.75 % cream  Commonly known as:  METROCREAM  Apply 1 application topically 2 (two) times daily.     pantoprazole 40 MG tablet  Commonly known as:  PROTONIX  Take 1 tablet (40 mg total) by mouth 2 (two) times daily.     Vitamin D (Ergocalciferol) 50000 units Caps capsule  Commonly known as:  DRISDOL  Take 50,000 Units by mouth every 7 (seven) days. Take on Mondays.        Filed Vitals:   11/18/15 1429 11/18/15 1445  BP: 104/58 119/62  Pulse: 57 52  Temp:  97.7 F (36.5 C)  Resp: 15 14    Skin clean, dry and intact without evidence of skin break down, no evidence of skin tears noted. IV catheter discontinued intact. Site without signs and symptoms of complications. Dressing and pressure applied. Pt denies pain at this time. No complaints noted.  An After Visit Summary was printed and given to the patient. Patient escorted via [place], and D/C home via private auto.  Dola Argyle

## 2015-11-18 NOTE — Consult Note (Signed)
Patient had diarrhea then bleeding which stopped yesterday.  Flex sig to 60cm showed a few diverticuli  Medium size internal hemorrhoids and a small polyp in prox sigmoid or distal descending colon.  Musosa of colon looked good.  Some liquid stool suctioned to send for analysis.  I think she can go home given her improvement and absence of significant findings today.  Bleeding likely came from diarrhea effect on hemorrhoids.  Will need repeat colonoscopy some time this year due to small polyp noted today.  Discussed with her husband.

## 2015-11-18 NOTE — Op Note (Signed)
[facility] Patient Name: Jody Allen Procedure Date: 11/18/2015 1:32 PM MRN: [id number] Account #: [id number] Date of Birth: 1951/01/30 Admit Type: Inpatient Age: 65 Room: [facility] ENDO ROOM 4 Gender: Female Note Status: Finalized Procedure:            Flexible Sigmoidoscopy Indications:          Rectal hemorrhage Providers:            Manya Silvas, MD Referring MD:         Ramonita Lab, MD (Referring MD) Medicines:            Fentanyl 75 micrograms IV, Midazolam 4 mg IV,                        Ondansetron 4 mg IV Complications:        No immediate complications. Procedure:            Pre-Anesthesia Assessment:                       - After reviewing the risks and benefits, the patient                        was deemed in satisfactory condition to undergo the                        procedure.                       After obtaining informed consent, the scope was passed                        under direct vision. The Colonoscope was introduced                        through the anus and advanced to the the splenic                        flexure. The flexible sigmoidoscopy was accomplished                        without difficulty. The patient tolerated the procedure                        well. The quality of the bowel preparation was adequate                        to identify polyps. Findings:      The mucosal lining of the colon was normal and liquid stool sent for       analysis.      A small polyp was found in the proximal sigmoid colon/distal descending       colon. The polyp was sessile.      A single small-mouthed diverticulum was found in the sigmoid colon.      Internal hemorrhoids were found during endoscopy. The hemorrhoids were       medium-sized and Grade I (internal hemorrhoids that do not prolapse). Impression:           - One small polyp in the proximal sigmoid colon.                       -  Diverticulosis in the sigmoid  colon.                       - Internal hemorrhoids.                       - No specimens collected. Recommendation:       - Await pathology results. Manya Silvas, MD 11/18/2015 1:54:43 PM This report has been signed electronically. Number of Addenda: 0 Note Initiated On: 11/18/2015 1:32 PM      [facility]

## 2015-11-19 ENCOUNTER — Encounter: Payer: Self-pay | Admitting: Unknown Physician Specialty

## 2015-11-21 DIAGNOSIS — F3342 Major depressive disorder, recurrent, in full remission: Secondary | ICD-10-CM | POA: Insufficient documentation

## 2015-11-21 NOTE — Discharge Summary (Signed)
[facility] NAME: Jody Allen    MR#:  [id number]  DATE OF BIRTH:  March 01, 1951  DATE OF ADMISSION:  11/16/2015 ADMITTING PHYSICIAN: Idelle Crouch, MD  DATE OF DISCHARGE: 11/18/2015  5:51 PM  PRIMARY CARE PHYSICIAN: Tama High III, MD    ADMISSION DIAGNOSIS:  Bright red blood per rectum [K62.5]  DISCHARGE DIAGNOSIS:  Active Problems:   LGI bleed   Abdominal cramping   Acute diarrhea   History of colon polyps  SECONDARY DIAGNOSIS:   Past Medical History  Diagnosis Date  . GERD (gastroesophageal reflux disease)   . History of hiatal hernia   . Depression     history of ect  . Rosacea, acne   . MVP (mitral valve prolapse)   . Fatty liver disease, nonalcoholic   . Shoulder ankylosis     right   HOSPITAL COURSE:  65 y.o. female has a past medical history significant for GERD, Rosacea, and hiatal hernia s/p surgical repair admitted for abdominal cramping with diarrhea and recurrent episodes of BRBPR.    * LGI bleed: thought to be due to hemorrhoids * Abdominal cramping: resolved * Acute diarrhea: no BM in the Hospital * History of colon polyps: Flex sig on 4/4 by Dr Vira Agar showed a few diverticuli Medium size internal hemorrhoids and a small polyp in prox sigmoid or distal descending colon. Musosa of colon looked good. Some liquid stool suctioned to send for analysis * hypokalemia: repleted  She didn't have any further diarrhea while in the Hospital, Abd cramping improved and was tolerating diet. After d/w Dr Vira Agar it was felt that she is stable for D/C with outpt f/up for further eval if continues to have symptoms. Patient and family were in agreement. DISCHARGE CONDITIONS:   stable  CONSULTS OBTAINED:  Treatment Team:  Manya Silvas, MD  DRUG ALLERGIES:   Allergies  Allergen Reactions  . Phenothiazines     Other reaction(s): Other (See Comments) Other Reaction: TORTICOLLIS (MUSCLE SPASM  IN T  . Compazine [Prochlorperazine Edisylate] Other (See Comments)    Twists head,locks muscles  . Flagyl [Metronidazole] Nausea And Vomiting  . Morphine And Related Nausea And Vomiting  . Penicillins Hives and Rash    Has patient had a PCN reaction causing immediate rash, facial/tongue/throat swelling, SOB or lightheadedness with hypotension: Yes Has patient had a PCN reaction causing severe rash involving mucus membranes or skin necrosis: No Has patient had a PCN reaction that required hospitalization No Has patient had a PCN reaction occurring within the last 10 years: No If all of the above answers are "NO", then may proceed with Cephalosporin use.    DISCHARGE MEDICATIONS:   Discharge Medication List as of 11/18/2015  4:49 PM    CONTINUE these medications which have NOT CHANGED   Details  cyanocobalamin (,VITAMIN B-12,) 1000 MCG/ML injection Inject 1 mL into the muscle every 30 (thirty) days., Starting 06/19/2015, Until Discontinued, Historical Med    doxycycline (VIBRAMYCIN) 50 MG capsule Take 50 mg by mouth daily., Until Discontinued, Historical Med    estradiol (VIVELLE-DOT) 0.05 MG/24HR patch Place 1 patch onto the skin once a week. Put on Mondays., Until Discontinued, Historical Med    metroNIDAZOLE (METROCREAM) 0.75 % cream Apply 1 application topically 2 (two) times daily. , Until Discontinued, Historical Med    pantoprazole (PROTONIX) 40 MG tablet Take 1 tablet (40 mg total) by mouth 2 (two) times daily., Starting 12/21/2014, Until Discontinued,  Normal    Vitamin D, Ergocalciferol, (DRISDOL) 50000 UNITS CAPS capsule Take 50,000 Units by mouth every 7 (seven) days. Take on Mondays., Until Discontinued, Historical Med      STOP taking these medications     HYDROcodone-acetaminophen (NORCO/VICODIN) 5-325 MG per tablet      vitamin B-12 (CYANOCOBALAMIN) 1000 MCG tablet          DISCHARGE INSTRUCTIONS:    DIET:  Regular diet  DISCHARGE CONDITION:   Good  ACTIVITY:  Activity as tolerated  OXYGEN:  Home Oxygen: No.   Oxygen Delivery: room air  DISCHARGE LOCATION:  home   If you experience worsening of your admission symptoms, develop shortness of breath, life threatening emergency, suicidal or homicidal thoughts you must seek medical attention immediately by calling 911 or calling your MD immediately  if symptoms less severe.  You Must read complete instructions/literature along with all the possible adverse reactions/side effects for all the Medicines you take and that have been prescribed to you. Take any new Medicines after you have completely understood and accpet all the possible adverse reactions/side effects.   Please note  You were cared for by a hospitalist during your hospital stay. If you have any questions about your discharge medications or the care you received while you were in the hospital after you are discharged, you can call the unit and asked to speak with the hospitalist on call if the hospitalist that took care of you is not available. Once you are discharged, your primary care physician will handle any further medical issues. Please note that NO REFILLS for any discharge medications will be authorized once you are discharged, as it is imperative that you return to your primary care physician (or establish a relationship with a primary care physician if you do not have one) for your aftercare needs so that they can reassess your need for medications and monitor your lab values.    On the day of Discharge:  VITAL SIGNS:  Blood pressure 119/62, pulse 52, temperature 97.7 F (36.5 C), temperature source Oral, resp. rate 14, height 5\' 5"  (1.651 m), weight 65.363 kg (144 lb 1.6 oz), SpO2 96 %. PHYSICAL EXAMINATION:  GENERAL:  65 y.o.-year-old patient lying in the bed with no acute distress.  EYES: Pupils equal, round, reactive to light and accommodation. No scleral icterus. Extraocular muscles intact.  HEENT: Head  atraumatic, normocephalic. Oropharynx and nasopharynx clear.  NECK:  Supple, no jugular venous distention. No thyroid enlargement, no tenderness.  LUNGS: Normal breath sounds bilaterally, no wheezing, rales,rhonchi or crepitation. No use of accessory muscles of respiration.  CARDIOVASCULAR: S1, S2 normal. No murmurs, rubs, or gallops.  ABDOMEN: Soft, non-tender, non-distended. Bowel sounds present. No organomegaly or mass.  EXTREMITIES: No pedal edema, cyanosis, or clubbing.  NEUROLOGIC: Cranial nerves II through XII are intact. Muscle strength 5/5 in all extremities. Sensation intact. Gait not checked.  PSYCHIATRIC: The patient is alert and oriented x 3.  SKIN: No obvious rash, lesion, or ulcer.  DATA REVIEW:   CBC  Recent Labs Lab 11/18/15 0420  WBC 4.3  HGB 13.7  HCT 39.6  PLT 118*    Chemistries   Recent Labs Lab 11/17/15 0555 11/18/15 0420  NA 139 139  K 3.4* 3.3*  CL 113* 114*  CO2 25 22  GLUCOSE 108* 100*  BUN 8 6  CREATININE 0.78 0.83  CALCIUM 8.4* 8.2*  AST 31  --   ALT 42  --   ALKPHOS 43  --  BILITOT 1.0  --     Management plans discussed with the patient, family and they are in agreement.  CODE STATUS: FULL CODE  TOTAL TIME TAKING CARE OF THIS PATIENT: 45 minutes.    [facility], Finnleigh Marchetti M.D on 11/21/2015 at 11:40 PM  Between 7am to 6pm - Pager - [phone]  After 6pm go to [email or web address] - password [email or web address] [place] [facility]  Office  [phone]  CC: Primary care physician; Adin Hector, MD   Note: This dictation was prepared with Dragon dictation along with smaller phrase technology. Any transcriptional errors that result from this process are unintentional.

## 2015-12-26 ENCOUNTER — Encounter: Payer: Self-pay | Admitting: *Deleted

## 2015-12-29 ENCOUNTER — Ambulatory Visit: Payer: BLUE CROSS/BLUE SHIELD | Admitting: Certified Registered Nurse Anesthetist

## 2015-12-29 ENCOUNTER — Ambulatory Visit
Admission: RE | Admit: 2015-12-29 | Discharge: 2015-12-29 | Disposition: A | Discharge status: Home / Self Care | Payer: BLUE CROSS/BLUE SHIELD | Source: Ambulatory Visit | Attending: Unknown Physician Specialty | Admitting: Unknown Physician Specialty

## 2015-12-29 ENCOUNTER — Encounter: Payer: Self-pay | Admitting: *Deleted

## 2015-12-29 ENCOUNTER — Encounter
Admission: RE | Disposition: A | Discharge status: Home / Self Care | Payer: Self-pay | Source: Ambulatory Visit | Attending: Unknown Physician Specialty

## 2015-12-29 DIAGNOSIS — R1013 Epigastric pain: Secondary | ICD-10-CM | POA: Diagnosis not present

## 2015-12-29 DIAGNOSIS — I341 Nonrheumatic mitral (valve) prolapse: Secondary | ICD-10-CM | POA: Insufficient documentation

## 2015-12-29 DIAGNOSIS — K3189 Other diseases of stomach and duodenum: Secondary | ICD-10-CM

## 2015-12-29 DIAGNOSIS — Z888 Allergy status to other drugs, medicaments and biological substances status: Secondary | ICD-10-CM | POA: Diagnosis not present

## 2015-12-29 DIAGNOSIS — Z880 Allergy status to penicillin: Secondary | ICD-10-CM | POA: Diagnosis not present

## 2015-12-29 DIAGNOSIS — Z803 Family history of malignant neoplasm of breast: Secondary | ICD-10-CM | POA: Diagnosis not present

## 2015-12-29 DIAGNOSIS — M810 Age-related osteoporosis without current pathological fracture: Secondary | ICD-10-CM | POA: Diagnosis not present

## 2015-12-29 DIAGNOSIS — K529 Noninfective gastroenteritis and colitis, unspecified: Secondary | ICD-10-CM | POA: Diagnosis present

## 2015-12-29 DIAGNOSIS — K760 Fatty (change of) liver, not elsewhere classified: Secondary | ICD-10-CM | POA: Insufficient documentation

## 2015-12-29 DIAGNOSIS — E538 Deficiency of other specified B group vitamins: Secondary | ICD-10-CM | POA: Diagnosis not present

## 2015-12-29 DIAGNOSIS — K625 Hemorrhage of anus and rectum: Secondary | ICD-10-CM | POA: Diagnosis not present

## 2015-12-29 DIAGNOSIS — Z885 Allergy status to narcotic agent status: Secondary | ICD-10-CM | POA: Insufficient documentation

## 2015-12-29 DIAGNOSIS — Z79899 Other long term (current) drug therapy: Secondary | ICD-10-CM | POA: Insufficient documentation

## 2015-12-29 DIAGNOSIS — R1084 Generalized abdominal pain: Secondary | ICD-10-CM | POA: Diagnosis not present

## 2015-12-29 DIAGNOSIS — R1030 Lower abdominal pain, unspecified: Secondary | ICD-10-CM | POA: Insufficient documentation

## 2015-12-29 DIAGNOSIS — Z883 Allergy status to other anti-infective agents status: Secondary | ICD-10-CM | POA: Insufficient documentation

## 2015-12-29 DIAGNOSIS — E559 Vitamin D deficiency, unspecified: Secondary | ICD-10-CM | POA: Diagnosis not present

## 2015-12-29 DIAGNOSIS — F329 Major depressive disorder, single episode, unspecified: Secondary | ICD-10-CM | POA: Diagnosis not present

## 2015-12-29 DIAGNOSIS — K219 Gastro-esophageal reflux disease without esophagitis: Secondary | ICD-10-CM | POA: Diagnosis present

## 2015-12-29 DIAGNOSIS — R195 Other fecal abnormalities: Secondary | ICD-10-CM | POA: Diagnosis present

## 2015-12-29 DIAGNOSIS — K640 First degree hemorrhoids: Secondary | ICD-10-CM | POA: Diagnosis not present

## 2015-12-29 DIAGNOSIS — D123 Benign neoplasm of transverse colon: Secondary | ICD-10-CM | POA: Diagnosis not present

## 2015-12-29 DIAGNOSIS — E7800 Pure hypercholesterolemia, unspecified: Secondary | ICD-10-CM | POA: Diagnosis not present

## 2015-12-29 HISTORY — DX: Thrombocytopenia, unspecified: D69.6

## 2015-12-29 HISTORY — DX: Age-related osteoporosis without current pathological fracture: M81.0

## 2015-12-29 HISTORY — PX: COLONOSCOPY WITH PROPOFOL: SHX5780

## 2015-12-29 HISTORY — DX: Pure hypercholesterolemia, unspecified: E78.00

## 2015-12-29 HISTORY — DX: Diffuse cystic mastopathy of unspecified breast: N60.19

## 2015-12-29 HISTORY — DX: Vitamin D deficiency, unspecified: E55.9

## 2015-12-29 HISTORY — PX: ESOPHAGOGASTRODUODENOSCOPY (EGD) WITH PROPOFOL: SHX5813

## 2015-12-29 HISTORY — DX: Deficiency of other specified B group vitamins: E53.8

## 2015-12-29 SURGERY — COLONOSCOPY WITH PROPOFOL
Anesthesia: General

## 2015-12-29 MED ORDER — MIDAZOLAM HCL 2 MG/2ML IJ SOLN
INTRAMUSCULAR | Status: DC | PRN
  Administered 2015-12-29: 1 mg via INTRAVENOUS

## 2015-12-29 MED ORDER — PROPOFOL 500 MG/50ML IV EMUL
INTRAVENOUS | Status: DC | PRN
  Administered 2015-12-29: 120 ug/kg/min via INTRAVENOUS

## 2015-12-29 MED ORDER — LIDOCAINE HCL (CARDIAC) 20 MG/ML IV SOLN
INTRAVENOUS | Status: DC | PRN
  Administered 2015-12-29: 30 mg via INTRAVENOUS

## 2015-12-29 MED ORDER — ONDANSETRON HCL 4 MG/2ML IJ SOLN
INTRAMUSCULAR | Status: DC | PRN
  Administered 2015-12-29: 4 mg via INTRAVENOUS

## 2015-12-29 MED ORDER — SODIUM CHLORIDE 0.9 % IV SOLN
INTRAVENOUS | Status: DC
  Administered 2015-12-29: 13:00:00 via INTRAVENOUS

## 2015-12-29 MED ORDER — PROPOFOL 10 MG/ML IV BOLUS
INTRAVENOUS | Status: DC | PRN
  Administered 2015-12-29: 20 mg via INTRAVENOUS
  Administered 2015-12-29: 30 mg via INTRAVENOUS

## 2015-12-29 MED ORDER — SODIUM CHLORIDE 0.9 % IV SOLN: INTRAVENOUS | Status: DC

## 2015-12-29 NOTE — Op Note (Signed)
[facility] Gastroenterology Patient Name: Jody Allen Procedure Date: 12/29/2015 12:59 PM MRN: [id number] Account #: [id number] Date of Birth: 1950-10-07 Admit Type: Outpatient Age: 65 Room: [facility] ENDO ROOM 1 Gender: Female Note Status: Finalized Procedure:            Colonoscopy Indications:          Chronic diarrhea, Clinically significant diarrhea of                        unexplained origin Providers:            Manya Silvas, MD Referring MD:         Ramonita Lab, MD (Referring MD) Medicines:            Propofol per Anesthesia Complications:        No immediate complications. Procedure:            Pre-Anesthesia Assessment:                       - After reviewing the risks and benefits, the patient                        was deemed in satisfactory condition to undergo the                        procedure.                       After obtaining informed consent, the colonoscope was                        passed under direct vision. Throughout the procedure,                        the patient's blood pressure, pulse, and oxygen                        saturations were monitored continuously. The                        Colonoscope was introduced through the anus and                        advanced to the the cecum, identified by appendiceal                        orifice and ileocecal valve. The colonoscopy was                        performed without difficulty. The patient tolerated the                        procedure well. The quality of the bowel preparation                        was adequate to identify polyps 6 mm and larger in size. Findings:      The prep was fair at best and in some areas poor and visualization not       possible despite 2L of lavage. Debris in cecal area. Due to chronic       diarrhea biopsies taken in  ascending,m transverse and descending and       sigmoid colon.      A 15 mm polyp was found in the transverse colon. The polyp  was sessile.       The polyp was removed with a hot snare. Resection and retrieval were       complete. To prevent bleeding after the polypectomy, four hemostatic       clips were successfully placed. There was no bleeding during, or at the       end, of the procedure.      A small polyp was found in the transverse colon. The polyp was sessile.       The polyp was removed with a hot snare. Resection and retrieval were       complete. To prevent bleeding after the polypectomy, one hemostatic clip       was successfully placed. There was no bleeding during, or at the end, of       the procedure.      Internal hemorrhoids were found during endoscopy. The hemorrhoids were       small and Grade I (internal hemorrhoids that do not prolapse). Impression:           - One 15 mm polyp in the transverse colon, removed with                        a hot snare. Resected and retrieved. Clips were placed.                       - One small polyp in the transverse colon, removed with                        a hot snare. Resected and retrieved. Clip was placed.                       - Internal hemorrhoids. Recommendation:       - Await pathology results. Manya Silvas, MD 12/29/2015 2:01:26 PM This report has been signed electronically. Number of Addenda: 0 Note Initiated On: 12/29/2015 12:59 PM Scope Withdrawal Time: 0 hours 13 minutes 23 seconds  Total Procedure Duration: 0 hours 35 minutes 46 seconds       [facility]

## 2015-12-29 NOTE — Anesthesia Preprocedure Evaluation (Signed)
Anesthesia Evaluation  Patient identified by MRN, date of birth, ID band Patient awake    Reviewed: Allergy & Precautions, H&P , NPO status , Patient's Chart, lab work & pertinent test results, reviewed documented beta blocker date and time   Airway Mallampati: II   Neck ROM: full    Dental  (+) Teeth Intact   Pulmonary neg pulmonary ROS,    Pulmonary exam normal        Cardiovascular negative cardio ROS Normal cardiovascular exam Rate:Normal     Neuro/Psych negative neurological ROS  negative psych ROS   GI/Hepatic negative GI ROS, Neg liver ROS, hiatal hernia, GERD  Medicated,  Endo/Other  negative endocrine ROS  Renal/GU negative Renal ROS  negative genitourinary   Musculoskeletal   Abdominal   Peds  Hematology negative hematology ROS (+)   Anesthesia Other Findings Past Medical History:   GERD (gastroesophageal reflux disease)                       History of hiatal hernia                                     Depression                                                     Comment:history of ect   Rosacea, acne                                                MVP (mitral valve prolapse)                                  Fatty liver disease, nonalcoholic                            Shoulder ankylosis                                             Comment:right   B12 deficiency                                               Fibrocystic breast changes                                   Osteoporosis                                                 Hypercholesterolemia  Thrombocytopenia ([place])                                       Vitamin D deficiency                                       Past Surgical History:   ABDOMINAL HYSTERECTOMY                                        APPENDECTOMY                                                  HIATAL HERNIA REPAIR                             N/A 12/19/2014       Comment:Procedure: LAPAROSCOPIC REPAIR OF HIATAL               HERNIA;  Surgeon: Rochel Brome, MD;  Location:               [facility] ORS;  Service: General;  Laterality: N/A;   SHOULDER SURGERY                                              FLEXIBLE SIGMOIDOSCOPY                          N/A 11/18/2015       Comment:Procedure: FLEXIBLE SIGMOIDOSCOPY;  Surgeon:               Manya Silvas, MD;  Location: [place];  Service: Endoscopy;  Laterality:               N/A;   HERNIA REPAIR                                                 COLONOSCOPY                                                   ESOPHAGOGASTRODUODENOSCOPY                                  BMI    Body Mass Index   22.46 kg/m 2     Reproductive/Obstetrics                             Anesthesia Physical Anesthesia Plan  ASA: II  Anesthesia Plan: General   Post-op Pain Management:    Induction:   Airway Management Planned:   Additional Equipment:   Intra-op Plan:   Post-operative Plan:   Informed Consent: I have reviewed the patients History and Physical, chart, labs and discussed the procedure including the risks, benefits and alternatives for the proposed anesthesia with the patient or authorized representative who has indicated his/her understanding and acceptance.   Dental Advisory Given  Plan Discussed with: CRNA  Anesthesia Plan Comments:         Anesthesia Quick Evaluation

## 2015-12-29 NOTE — Op Note (Signed)
[facility] Gastroenterology Patient Name: Jody Allen Procedure Date: 12/29/2015 12:59 PM MRN: [id number] Account #: [id number] Date of Birth: Feb 27, 1951 Admit Type: Outpatient Age: 65 Room: [facility] ENDO ROOM 1 Gender: Female Note Status: Finalized Procedure:            Upper GI endoscopy Indications:          Generalized abdominal pain Providers:            Manya Silvas, MD Referring MD:         Ramonita Lab, MD (Referring MD) Medicines:            Propofol per Anesthesia Complications:        No immediate complications. Procedure:            Pre-Anesthesia Assessment:                       - After reviewing the risks and benefits, the patient                        was deemed in satisfactory condition to undergo the                        procedure.                       After obtaining informed consent, the endoscope was                        passed under direct vision. Throughout the procedure,                        the patient's blood pressure, pulse, and oxygen                        saturations were monitored continuously. The Endoscope                        was introduced through the mouth, and advanced to the                        body of the stomach. The upper GI endoscopy was                        accomplished without difficulty. The patient tolerated                        the procedure well. The procedure was aborted due to                        presence of food. The upper GI endoscopy was                        accomplished without difficulty. The patient tolerated                        the procedure well. Findings:      A large amount of food (residue) was found in the gastric body.       Procedure was aborted. Impression:           - The procedure was aborted due to presence of food.                       -  No specimens collected. Recommendation:       - Perform a colonoscopy today. Manya Silvas, MD 12/29/2015 1:19:41 PM This  report has been signed electronically. Number of Addenda: 0 Note Initiated On: 12/29/2015 12:59 PM      [facility]

## 2015-12-29 NOTE — Anesthesia Procedure Notes (Signed)
Date/Time: 12/29/2015 1:07 PM Performed by: Johnna Acosta Pre-anesthesia Checklist: Patient identified, Emergency Drugs available, Suction available, Patient being monitored and Timeout performed Patient Re-evaluated:Patient Re-evaluated prior to inductionOxygen Delivery Method: Nasal cannula

## 2015-12-29 NOTE — H&P (Signed)
Primary Care Physician:  Adin Hector, MD Primary Gastroenterologist:  Dr. Lewis Moccasin  Pre-Procedure History & Physical: HPI:  Jody Allen is a 65 y.o. female is here for an endoscopy and colonoscopy.   Past Medical History  Diagnosis Date  . GERD (gastroesophageal reflux disease)   . History of hiatal hernia   . Depression     history of ect  . Rosacea, acne   . MVP (mitral valve prolapse)   . Fatty liver disease, nonalcoholic   . Shoulder ankylosis     right  . B12 deficiency   . Fibrocystic breast changes   . Osteoporosis   . Hypercholesterolemia   . Thrombocytopenia ([place])   . Vitamin D deficiency     Past Surgical History  Procedure Laterality Date  . Abdominal hysterectomy    . Appendectomy    . Hiatal hernia repair N/A 12/19/2014    Procedure: LAPAROSCOPIC REPAIR OF HIATAL HERNIA;  Surgeon: Rochel Brome, MD;  Location: [facility] ORS;  Service: General;  Laterality: N/A;  . Shoulder surgery    . Flexible sigmoidoscopy N/A 11/18/2015    Procedure: FLEXIBLE SIGMOIDOSCOPY;  Surgeon: Manya Silvas, MD;  Location: [facility] ENDOSCOPY;  Service: Endoscopy;  Laterality: N/A;  . Hernia repair    . Colonoscopy    . Esophagogastroduodenoscopy      Prior to Admission medications   Medication Sig Start Date End Date Taking? Authorizing Provider  cyanocobalamin (,VITAMIN B-12,) 1000 MCG/ML injection Inject 1 mL into the muscle every 30 (thirty) days. 06/19/15  Yes Historical Provider, MD  estradiol (VIVELLE-DOT) 0.05 MG/24HR patch Place 1 patch onto the skin once a week. Put on Mondays.   Yes Historical Provider, MD  metroNIDAZOLE (METROCREAM) 0.75 % cream Apply 1 application topically 2 (two) times daily.    Yes Historical Provider, MD  pantoprazole (PROTONIX) 40 MG tablet Take 1 tablet (40 mg total) by mouth 2 (two) times daily. 12/21/14  Yes Leonie Green, MD  sucralfate (CARAFATE) 1 g tablet Take 1 g by mouth 2 (two) times daily.   Yes Historical Provider, MD  Vitamin D,  Ergocalciferol, (DRISDOL) 50000 UNITS CAPS capsule Take 50,000 Units by mouth every 7 (seven) days. Take on Mondays.   Yes Historical Provider, MD  doxycycline (VIBRAMYCIN) 50 MG capsule Take 50 mg by mouth daily. Reported on 12/29/2015    Historical Provider, MD    Allergies as of 12/19/2015 - Review Complete 11/18/2015  Allergen Reaction Noted  . Phenothiazines  11/16/2015  . Compazine [prochlorperazine edisylate] Other (See Comments) 12/12/2014  . Flagyl [metronidazole] Nausea And Vomiting 12/11/2014  . Morphine and related Nausea And Vomiting 12/11/2014  . Penicillins Hives and Rash 12/11/2014    Family History  Problem Relation Age of Onset  . Breast cancer Maternal Aunt 38  . Breast cancer Paternal Aunt 68  . Breast cancer Maternal Grandmother 39  . Breast cancer Cousin 36    Social History   Social History  . Marital Status: Married    Spouse Name: N/A  . Number of Children: N/A  . Years of Education: N/A   Occupational History  . Not on file.   Social History Main Topics  . Smoking status: Never Smoker   . Smokeless tobacco: Never Used  . Alcohol Use: 0.6 oz/week    1 Shots of liquor per week  . Drug Use: No  . Sexual Activity: No   Other Topics Concern  . Not on file   Social History  Narrative    Review of Systems: See HPI, otherwise negative ROS  Physical Exam: BP 164/48 mmHg  Pulse 66  Temp(Src) 96.4 F (35.8 C) (Tympanic)  Resp 20  Ht 5\' 5"  (1.651 m)  Wt 61.236 kg (135 lb)  BMI 22.47 kg/m2  SpO2 100% General:   Alert,  pleasant and cooperative in NAD Head:  Normocephalic and atraumatic. Neck:  Supple; no masses or thyromegaly. Lungs:  Clear throughout to auscultation.    Heart:  Regular rate and rhythm. Abdomen:  Soft, nontender and nondistended. Normal bowel sounds, without guarding, and without rebound.   Neurologic:  Alert and  oriented x4;  grossly normal neurologically.  Impression/Plan: Jody Allen is here for an endoscopy and  colonoscopy to be performed for rectal bleeding, epigastric pain, GERD, bilateral lower abd pain, and diarrhea  Risks, benefits, limitations, and alternatives regarding  endoscopy and colonoscopy have been reviewed with the patient.  Questions have been answered.  All parties agreeable.   Gaylyn Cheers, MD  12/29/2015, 1:04 PM

## 2015-12-29 NOTE — Transfer of Care (Signed)
Immediate Anesthesia Transfer of Care Note  Patient: Jody Allen  Procedure(s) Performed: Procedure(s): COLONOSCOPY WITH PROPOFOL (N/A) ESOPHAGOGASTRODUODENOSCOPY (EGD) WITH PROPOFOL (N/A)  Patient Location: PACU  Anesthesia Type:General  Level of Consciousness: awake  Airway & Oxygen Therapy: Patient Spontanous Breathing and Patient connected to nasal cannula oxygen  Post-op Assessment: Report given to RN and Post -op Vital signs reviewed and stable  Post vital signs: Reviewed and stable  Last Vitals:  Filed Vitals:   12/29/15 1244 12/29/15 1400  BP: 164/48 105/59  Pulse: 66 79  Temp: 35.8 C 36 C  Resp: 20 19    Last Pain: There were no vitals filed for this visit.       Complications: No apparent anesthesia complications

## 2015-12-30 ENCOUNTER — Other Ambulatory Visit: Payer: Self-pay | Admitting: Unknown Physician Specialty

## 2015-12-30 DIAGNOSIS — K3189 Other diseases of stomach and duodenum: Secondary | ICD-10-CM

## 2015-12-30 LAB — SURGICAL PATHOLOGY

## 2015-12-31 ENCOUNTER — Encounter: Payer: Self-pay | Admitting: Unknown Physician Specialty

## 2016-01-01 NOTE — Anesthesia Postprocedure Evaluation (Signed)
Anesthesia Post Note  Patient: Jody Allen  Procedure(s) Performed: Procedure(s) (LRB): COLONOSCOPY WITH PROPOFOL (N/A) ESOPHAGOGASTRODUODENOSCOPY (EGD) WITH PROPOFOL (N/A)  Patient location during evaluation: PACU Anesthesia Type: General Level of consciousness: awake and alert Pain management: pain level controlled Vital Signs Assessment: post-procedure vital signs reviewed and stable Respiratory status: spontaneous breathing, nonlabored ventilation, respiratory function stable and patient connected to nasal cannula oxygen Cardiovascular status: blood pressure returned to baseline and stable Postop Assessment: no signs of nausea or vomiting Anesthetic complications: no    Last Vitals:  Filed Vitals:   12/29/15 1420 12/29/15 1430  BP: 117/67 116/82  Pulse: 67 63  Temp:    Resp: 18 17    Last Pain:  Filed Vitals:   12/30/15 0750  PainSc: 0-No pain                 Molli Barrows

## 2016-01-07 ENCOUNTER — Ambulatory Visit
Admission: RE | Admit: 2016-01-07 | Discharge: 2016-01-07 | Disposition: A | Discharge status: Home / Self Care | Payer: BLUE CROSS/BLUE SHIELD | Source: Ambulatory Visit | Attending: Unknown Physician Specialty | Admitting: Unknown Physician Specialty

## 2016-01-07 DIAGNOSIS — K3189 Other diseases of stomach and duodenum: Secondary | ICD-10-CM | POA: Insufficient documentation

## 2016-01-07 MED ORDER — TECHNETIUM TC 99M SULFUR COLLOID
2.2000 | Freq: Once | INTRAVENOUS | Status: AC | PRN
  Administered 2016-01-07: 2.16 via INTRAVENOUS

## 2016-01-13 ENCOUNTER — Other Ambulatory Visit: Payer: Self-pay | Admitting: Nurse Practitioner

## 2016-01-13 DIAGNOSIS — K3184 Gastroparesis: Secondary | ICD-10-CM

## 2016-01-14 ENCOUNTER — Ambulatory Visit
Admission: RE | Admit: 2016-01-14 | Discharge: 2016-01-14 | Disposition: A | Discharge status: Home / Self Care | Payer: BLUE CROSS/BLUE SHIELD | Source: Ambulatory Visit | Attending: Nurse Practitioner | Admitting: Nurse Practitioner

## 2016-01-14 ENCOUNTER — Other Ambulatory Visit: Payer: Self-pay | Admitting: Nurse Practitioner

## 2016-01-14 DIAGNOSIS — K449 Diaphragmatic hernia without obstruction or gangrene: Secondary | ICD-10-CM | POA: Insufficient documentation

## 2016-01-14 DIAGNOSIS — K3184 Gastroparesis: Secondary | ICD-10-CM | POA: Diagnosis not present

## 2016-01-14 DIAGNOSIS — R935 Abnormal findings on diagnostic imaging of other abdominal regions, including retroperitoneum: Secondary | ICD-10-CM | POA: Diagnosis not present

## 2016-01-26 ENCOUNTER — Ambulatory Visit: Admission: RE | Admit: 2016-01-26 | Payer: Medicare Other | Source: Ambulatory Visit

## 2016-01-29 ENCOUNTER — Ambulatory Visit: Admission: RE | Admit: 2016-01-29 | Payer: BLUE CROSS/BLUE SHIELD | Source: Ambulatory Visit

## 2016-02-11 ENCOUNTER — Ambulatory Visit
Admission: RE | Admit: 2016-02-11 | Discharge: 2016-02-11 | Disposition: A | Discharge status: Home / Self Care | Payer: BLUE CROSS/BLUE SHIELD | Source: Ambulatory Visit | Attending: Obstetrics and Gynecology | Admitting: Obstetrics and Gynecology

## 2016-02-11 DIAGNOSIS — Z1231 Encounter for screening mammogram for malignant neoplasm of breast: Secondary | ICD-10-CM | POA: Insufficient documentation

## 2016-02-11 DIAGNOSIS — Z1239 Encounter for other screening for malignant neoplasm of breast: Secondary | ICD-10-CM

## 2016-03-03 DIAGNOSIS — K3184 Gastroparesis: Secondary | ICD-10-CM | POA: Insufficient documentation

## 2017-02-04 ENCOUNTER — Other Ambulatory Visit: Payer: Self-pay | Admitting: Internal Medicine

## 2017-02-04 DIAGNOSIS — Z1231 Encounter for screening mammogram for malignant neoplasm of breast: Secondary | ICD-10-CM

## 2017-02-14 ENCOUNTER — Ambulatory Visit
Admission: RE | Admit: 2017-02-14 | Discharge: 2017-02-14 | Disposition: A | Discharge status: Home / Self Care | Payer: BLUE CROSS/BLUE SHIELD | Source: Ambulatory Visit | Attending: Internal Medicine | Admitting: Internal Medicine

## 2017-02-14 DIAGNOSIS — Z1231 Encounter for screening mammogram for malignant neoplasm of breast: Secondary | ICD-10-CM | POA: Diagnosis present

## 2017-04-27 DIAGNOSIS — M7061 Trochanteric bursitis, right hip: Secondary | ICD-10-CM | POA: Insufficient documentation

## 2017-08-19 DIAGNOSIS — N362 Urethral caruncle: Secondary | ICD-10-CM | POA: Insufficient documentation

## 2017-08-22 ENCOUNTER — Other Ambulatory Visit: Payer: Self-pay | Admitting: Obstetrics and Gynecology

## 2017-08-22 DIAGNOSIS — Z1239 Encounter for other screening for malignant neoplasm of breast: Secondary | ICD-10-CM

## 2018-01-18 ENCOUNTER — Other Ambulatory Visit: Payer: Self-pay | Admitting: Obstetrics and Gynecology

## 2018-01-18 DIAGNOSIS — Z1231 Encounter for screening mammogram for malignant neoplasm of breast: Secondary | ICD-10-CM

## 2018-02-15 ENCOUNTER — Ambulatory Visit
Admission: RE | Admit: 2018-02-15 | Discharge: 2018-02-15 | Disposition: A | Discharge status: Home / Self Care | Payer: 59 | Source: Ambulatory Visit | Attending: Obstetrics and Gynecology | Admitting: Obstetrics and Gynecology

## 2018-02-15 DIAGNOSIS — Z1231 Encounter for screening mammogram for malignant neoplasm of breast: Secondary | ICD-10-CM | POA: Insufficient documentation

## 2018-02-15 HISTORY — DX: Malignant (primary) neoplasm, unspecified: C80.1

## 2018-09-07 ENCOUNTER — Other Ambulatory Visit: Payer: Self-pay | Admitting: Internal Medicine

## 2018-09-07 DIAGNOSIS — R109 Unspecified abdominal pain: Secondary | ICD-10-CM

## 2018-09-08 ENCOUNTER — Ambulatory Visit
Admission: RE | Admit: 2018-09-08 | Discharge: 2018-09-08 | Disposition: A | Discharge status: Home / Self Care | Payer: BLUE CROSS/BLUE SHIELD | Source: Ambulatory Visit | Attending: Internal Medicine | Admitting: Internal Medicine

## 2018-09-08 DIAGNOSIS — R109 Unspecified abdominal pain: Secondary | ICD-10-CM | POA: Diagnosis not present

## 2018-09-08 MED ORDER — IOHEXOL 300 MG/ML  SOLN
100.0000 mL | Freq: Once | INTRAMUSCULAR | Status: AC | PRN
  Administered 2018-09-08: 100 mL via INTRAVENOUS

## 2018-09-13 DIAGNOSIS — K760 Fatty (change of) liver, not elsewhere classified: Secondary | ICD-10-CM | POA: Insufficient documentation

## 2018-10-03 DIAGNOSIS — C4492 Squamous cell carcinoma of skin, unspecified: Secondary | ICD-10-CM | POA: Insufficient documentation

## 2018-10-10 DIAGNOSIS — L719 Rosacea, unspecified: Secondary | ICD-10-CM | POA: Insufficient documentation

## 2018-10-10 DIAGNOSIS — M810 Age-related osteoporosis without current pathological fracture: Secondary | ICD-10-CM | POA: Insufficient documentation

## 2018-10-10 DIAGNOSIS — E559 Vitamin D deficiency, unspecified: Secondary | ICD-10-CM | POA: Insufficient documentation

## 2018-10-10 DIAGNOSIS — I341 Nonrheumatic mitral (valve) prolapse: Secondary | ICD-10-CM | POA: Insufficient documentation

## 2018-10-10 DIAGNOSIS — N6019 Diffuse cystic mastopathy of unspecified breast: Secondary | ICD-10-CM | POA: Insufficient documentation

## 2019-02-05 ENCOUNTER — Other Ambulatory Visit: Payer: Self-pay | Admitting: Internal Medicine

## 2019-02-05 DIAGNOSIS — Z1231 Encounter for screening mammogram for malignant neoplasm of breast: Secondary | ICD-10-CM

## 2019-02-21 ENCOUNTER — Other Ambulatory Visit: Payer: Self-pay

## 2019-02-21 ENCOUNTER — Ambulatory Visit
Admission: RE | Admit: 2019-02-21 | Discharge: 2019-02-21 | Disposition: A | Discharge status: Home / Self Care | Payer: BLUE CROSS/BLUE SHIELD | Source: Ambulatory Visit | Attending: Internal Medicine | Admitting: Internal Medicine

## 2019-02-21 DIAGNOSIS — Z1231 Encounter for screening mammogram for malignant neoplasm of breast: Secondary | ICD-10-CM | POA: Insufficient documentation

## 2019-08-01 ENCOUNTER — Other Ambulatory Visit: Payer: Self-pay | Admitting: Obstetrics and Gynecology

## 2019-08-01 DIAGNOSIS — Z1231 Encounter for screening mammogram for malignant neoplasm of breast: Secondary | ICD-10-CM

## 2020-03-20 ENCOUNTER — Ambulatory Visit
Admission: RE | Admit: 2020-03-20 | Discharge: 2020-03-20 | Disposition: A | Discharge status: Home / Self Care | Payer: Medicare Other | Source: Ambulatory Visit | Attending: Obstetrics and Gynecology | Admitting: Obstetrics and Gynecology

## 2020-03-20 ENCOUNTER — Other Ambulatory Visit: Payer: Self-pay

## 2020-03-20 DIAGNOSIS — Z1231 Encounter for screening mammogram for malignant neoplasm of breast: Secondary | ICD-10-CM

## 2020-03-25 IMAGING — CT CT ABD-PELV W/ CM
2 of 5 series · 16 of 46 positions shown, 18 images · IV contrast (APPLIED)
Comparison: None.

CLINICAL DATA: LEFT upper quadrant pain for 1 month

EXAM:
CT ABDOMEN AND PELVIS WITH CONTRAST
TECHNIQUE: Multidetector CT imaging of the abdomen and pelvis was performed
using the standard protocol following bolus administration of
intravenous contrast.
CONTRAST:  100mL OMNIPAQUE IOHEXOL 300 MG/ML  SOLN

[Series 2: routine abd/pel with · axial · 0.67mm/px · z∈[-301,+94]mm · 13 of 89 slices shown, 15 images]
[im 5/89  soft-tissue]
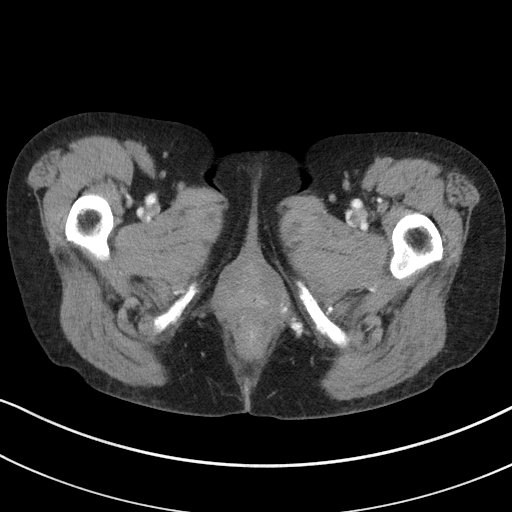
[im 5/89  bone]
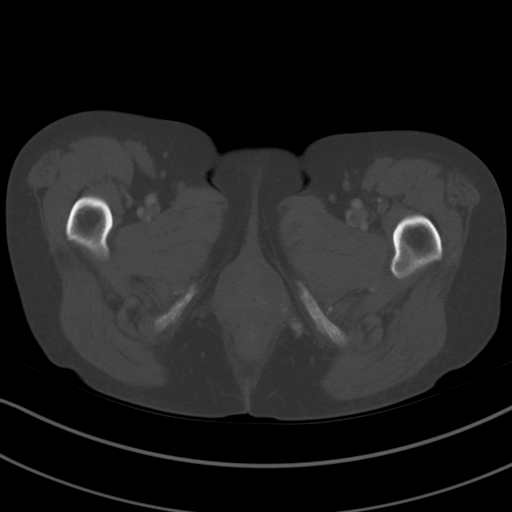
[im 14/89  soft-tissue]
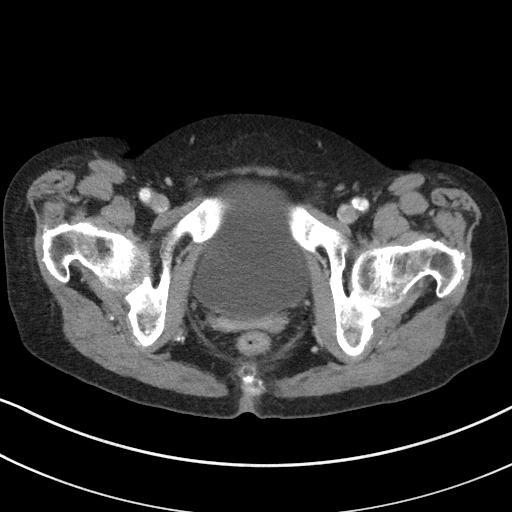
[im 18/89  soft-tissue]
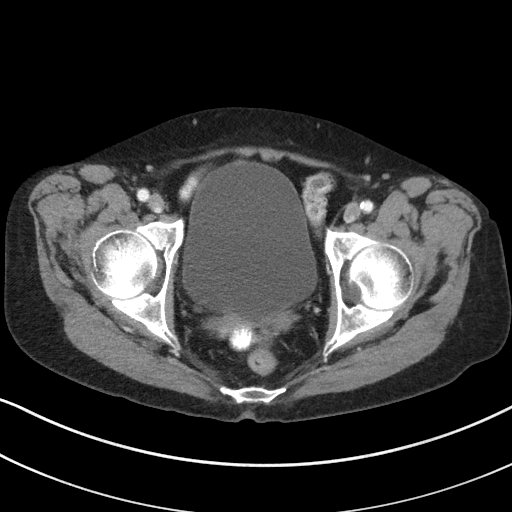
[im 27/89  soft-tissue]
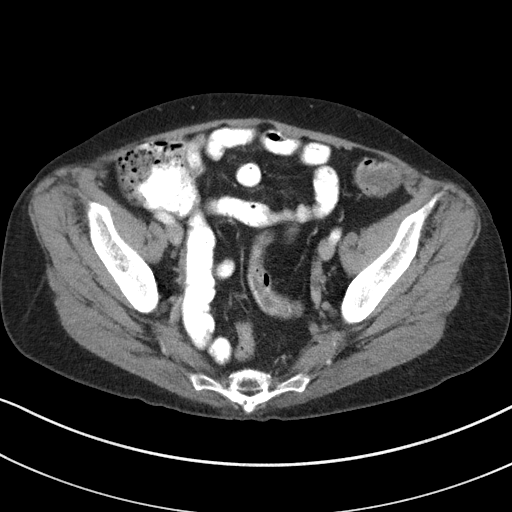
[im 31/89  soft-tissue]
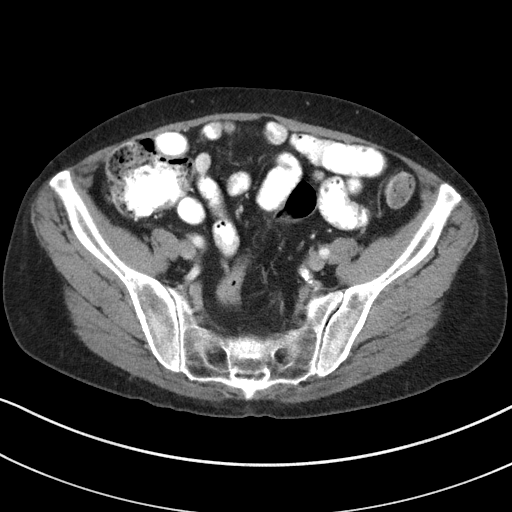
[im 40/89  soft-tissue]
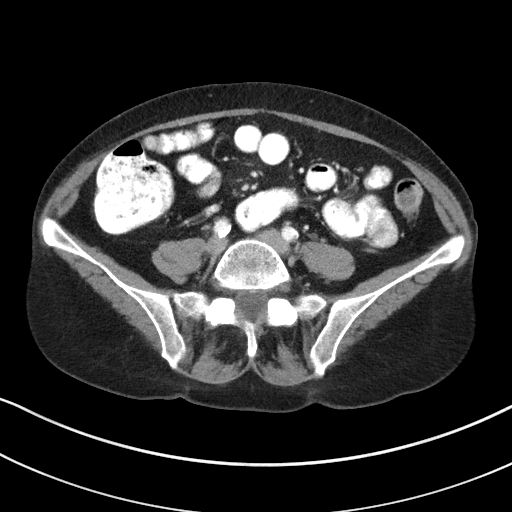
[im 45/89  soft-tissue]
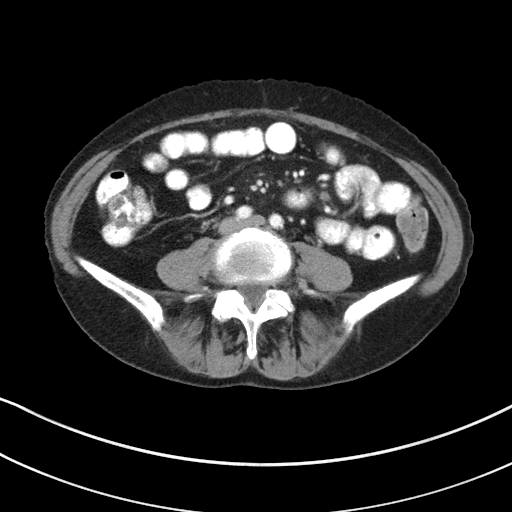
[im 49/89  soft-tissue]
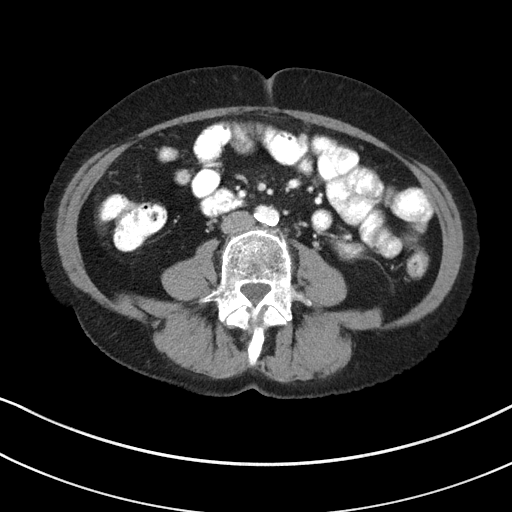
[im 58/89  soft-tissue]
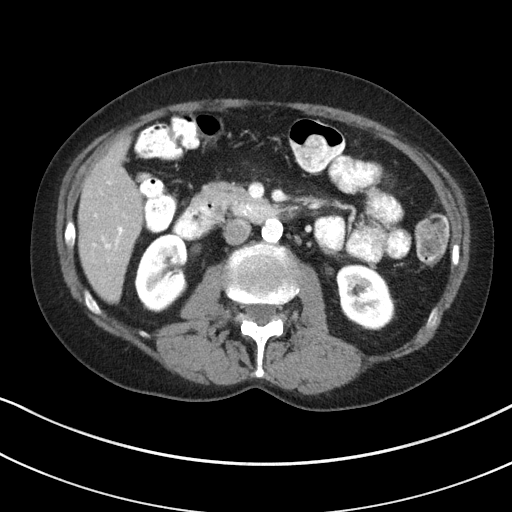
[im 58/89  bone]
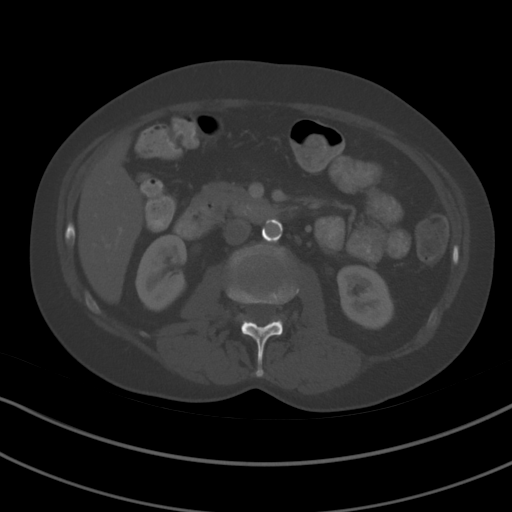
[im 62/89  soft-tissue]
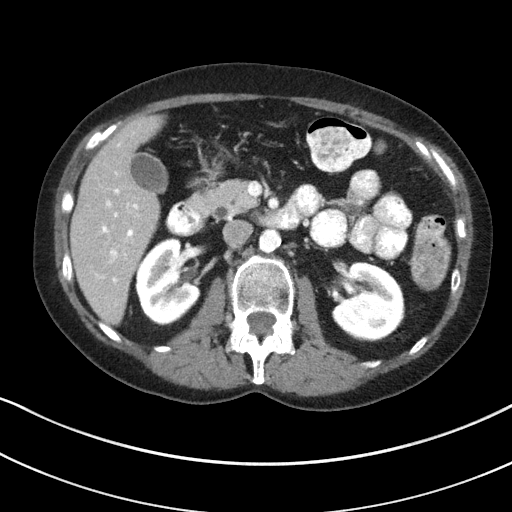
[im 71/89  soft-tissue]
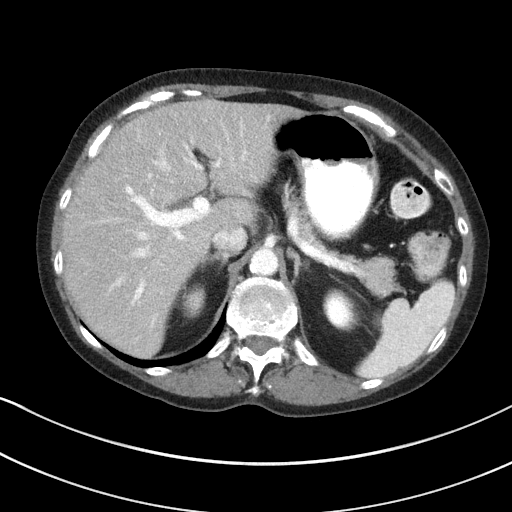
[im 75/89  soft-tissue]
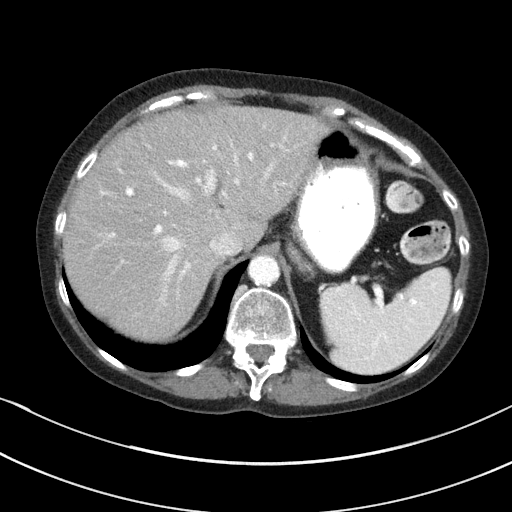
[im 84/89  soft-tissue]
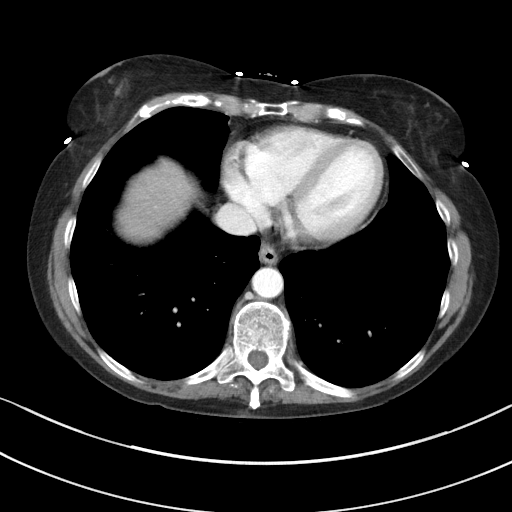

[Series 5: coronal st · coronal · 0.62mm/px · 3 of 81 slices shown]
[im 27/81  soft-tissue]
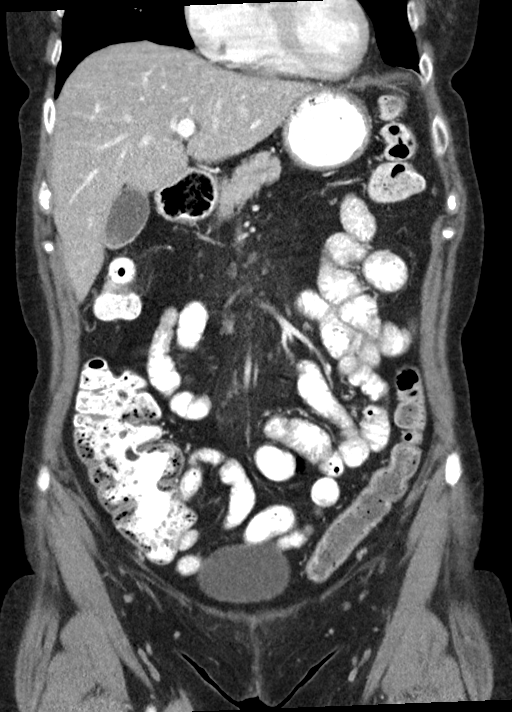
[im 36/81  soft-tissue]
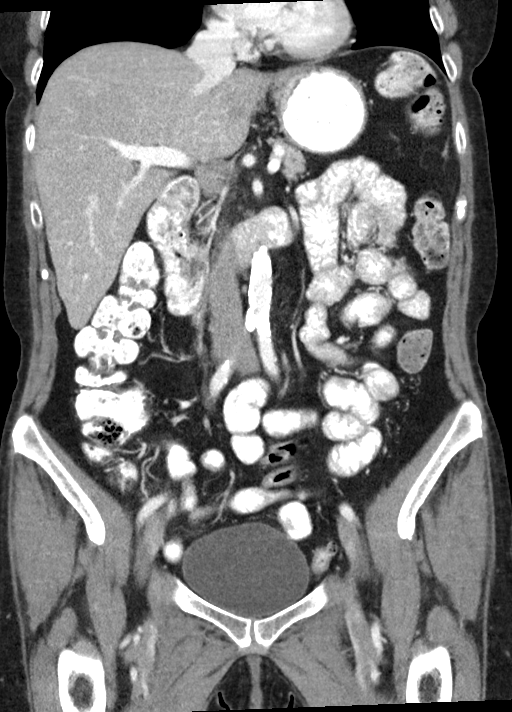
[im 45/81  soft-tissue]
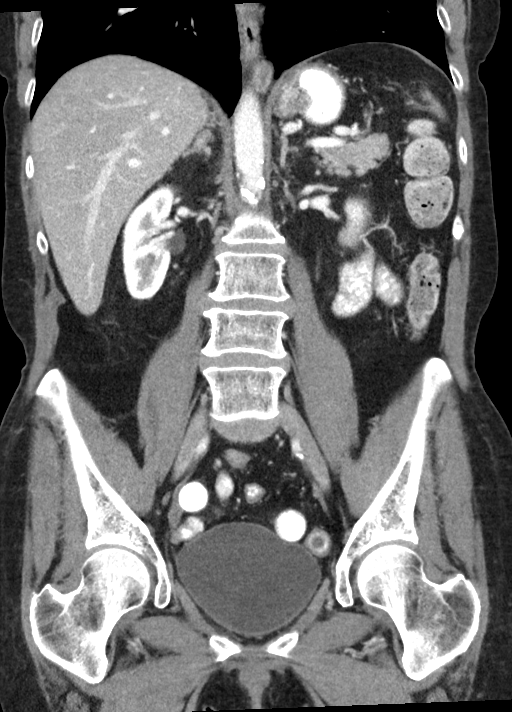

[16 of 46 positions shown; findings below may reference images not displayed]

FINDINGS: Lower chest: Lung bases are clear.

Hepatobiliary: No focal hepatic lesion. No biliary duct dilatation.
Gallbladder is normal. Common bile duct is normal.

Pancreas: Pancreas is normal. No ductal dilatation. No pancreatic
inflammation.

Spleen: Normal spleen

Adrenals/urinary tract: Adrenal glands and kidneys are normal. The
ureters and bladder normal.

Stomach/Bowel: Lobular thickening through the GE junction/gastric
cardia (image [DATE]). This thickening is increased from 13 mm to 20
mm (image [DATE]) compared to CT of 11/16/2015. Small hiatal hernia.
Stomach, small-bowel cecum normal. Appendix not identified.

Vascular/Lymphatic: Abdominal aorta is normal caliber with
atherosclerotic calcification. There is no retroperitoneal or
periportal lymphadenopathy. No pelvic lymphadenopathy.

Reproductive: Post hysterectomy

Other: No free fluid.

Musculoskeletal: No aggressive osseous lesion.
IMPRESSION: 1. No acute findings in the abdomen pelvis.
2. Progressive thickening through the GE junction and gastric
cardia. Recommend upper endoscopy for evaluation of the cardia.

## 2021-05-01 ENCOUNTER — Other Ambulatory Visit: Payer: Self-pay | Admitting: Internal Medicine

## 2021-05-01 DIAGNOSIS — Z1231 Encounter for screening mammogram for malignant neoplasm of breast: Secondary | ICD-10-CM

## 2021-05-19 ENCOUNTER — Ambulatory Visit: Payer: Medicare Other

## 2021-05-25 ENCOUNTER — Ambulatory Visit
Admission: RE | Admit: 2021-05-25 | Discharge: 2021-05-25 | Disposition: A | Discharge status: Home / Self Care | Payer: Medicare Other | Source: Ambulatory Visit | Attending: Internal Medicine | Admitting: Internal Medicine

## 2021-05-25 ENCOUNTER — Other Ambulatory Visit: Payer: Self-pay

## 2021-05-25 DIAGNOSIS — Z1231 Encounter for screening mammogram for malignant neoplasm of breast: Secondary | ICD-10-CM | POA: Insufficient documentation

## 2022-04-12 DIAGNOSIS — E8801 Alpha-1-antitrypsin deficiency: Secondary | ICD-10-CM | POA: Insufficient documentation

## 2022-05-11 ENCOUNTER — Other Ambulatory Visit: Payer: Self-pay | Admitting: Internal Medicine

## 2022-05-11 DIAGNOSIS — Z1231 Encounter for screening mammogram for malignant neoplasm of breast: Secondary | ICD-10-CM

## 2022-05-26 ENCOUNTER — Ambulatory Visit
Admission: RE | Admit: 2022-05-26 | Discharge: 2022-05-26 | Disposition: A | Discharge status: Home / Self Care | Payer: Medicare Other | Source: Ambulatory Visit | Attending: Internal Medicine | Admitting: Internal Medicine

## 2022-05-26 DIAGNOSIS — Z1231 Encounter for screening mammogram for malignant neoplasm of breast: Secondary | ICD-10-CM | POA: Insufficient documentation

## 2022-11-02 ENCOUNTER — Encounter: Payer: Self-pay | Admitting: Gastroenterology

## 2022-11-02 ENCOUNTER — Other Ambulatory Visit: Payer: Self-pay

## 2022-11-02 ENCOUNTER — Telehealth: Payer: Self-pay

## 2022-11-02 ENCOUNTER — Ambulatory Visit: Payer: Medicare Other | Admitting: Gastroenterology

## 2022-11-02 VITALS — BP 162/74 | HR 76 | Temp 98.2°F | Ht 66.0 in | Wt 123.5 lb

## 2022-11-02 DIAGNOSIS — Z86010 Personal history of colonic polyps: Secondary | ICD-10-CM

## 2022-11-02 DIAGNOSIS — K219 Gastro-esophageal reflux disease without esophagitis: Secondary | ICD-10-CM | POA: Diagnosis not present

## 2022-11-02 DIAGNOSIS — R1013 Epigastric pain: Secondary | ICD-10-CM

## 2022-11-02 MED ORDER — NA SULFATE-K SULFATE-MG SULF 17.5-3.13-1.6 GM/177ML PO SOLN: 354.0000 mL | Freq: Once | ORAL | 0 refills | Status: AC

## 2022-11-02 NOTE — Patient Instructions (Addendum)
Please take Omeprazole 20mg  OTC one capsule before breakfast. Please let us know if this is not helping and we can add a EGD to your colonoscopy. You can call Caryl Pina at [phone]

## 2022-11-02 NOTE — Progress Notes (Signed)
Cephas Darby, MD [address]  [place]  [place], [place] [address]  Main: [phone]  Fax: [phone]    Gastroenterology Consultation  Referring Provider:     Adin Hector, MD Primary Care Physician:  Adin Hector, MD Primary Gastroenterologist:  Dr. Cephas Darby Reason for Consultation: Dyspepsia        HPI:   Jody Allen is a 72 y.o. female referred by Dr. Caryl Comes, Tonette Bihari, MD  for consultation & management of dyspepsia.  Patient states that she has been experiencing symptoms of burning in her chest, regurgitation of food, epigastric discomfort.  She reports severe heartburn at night that wakes her up from sleep associated with cough.  She had history of Nissen's fundoplication in Q000111Q followed by diagnosis of gastroparesis in 2017 and underwent pyloroplasty.  Since then, she has been following small frequent meals and follows healthy diet.  She also reports some abdominal bloating.  She denies any difficulty swallowing.  She is not on any PPI or H2 blocker.  She takes Mylanta as needed only.  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  Her last EGD was by Dr. Vira Agar at [facility] after abnormal CT abdomen and pelvis in 08/2018.  It was reportedly normal.  NSAIDs: None  Antiplts/Anticoagulants/Anti thrombotics: None  GI Procedures:  Upper endoscopy 09/16/2014 DIAGNOSIS:  A. STOMACH, NOS; MUCOSAL BIOPSIES:  - CHRONIC SUPERFICIAL GASTRITIS, INACTIVE.  - NO HELICOBACTER PYLORI ORGANISMS OR DYSPLASIA SEEN.  - SEE COMMENT.   B.  ESOPHAGUS, ULCER; MUCOSAL BIOPSIES:  - ACTIVE ESOPHAGITIS WITH SUPERFICIAL EROSIONS.  - NO GLANDULAR EPITHELIUM, VIRAL INCLUSIONS, OR DYSPLASIA SEEN.  - GMS STAIN FOR FUNGAL ORGANISMS IS NEGATIVE.   Colonoscopy 12/29/2015 DIAGNOSIS: A. COLON POLYP, TRANSVERSE; HOT SNARE AND COLD BIOPSY: - TUBULAR ADENOMA, 5 FRAGMENTS. - NEGATIVE FOR HIGH-GRADE DYSPLASIA AND MALIGNANCY.  B. COLON, CECUM;  COLD BIOPSY: - COLONIC MUCOSA NEGATIVE FOR MICROSCOPIC COLITIS, DYSPLASIA AND MALIGNANCY.  C. COLON, TRANSVERSE; COLD BIOPSY: - COLONIC MUCOSA NEGATIVE FOR MICROSCOPIC COLITIS, DYSPLASIA AND MALIGNANCY.  D. COLON POLYP, TRANSVERSE; HOT SNARE: - TUBULAR ADENOMA, 3 FRAGMENTS. - NEGATIVE FOR HIGH-GRADE DYSPLASIA AND MALIGNANCY.  E. COLON, DESCENDING AND SIGMOID; COLD BIOPSY: - COLONIC MUCOSA NEGATIVE FOR MICROSCOPIC COLITIS, DYSPLASIA AND MALIGNANCY.   Past Medical History:  Diagnosis Date   B12 deficiency    Cancer ([place])    squamous cell on leg, nose   Depression    history of ect   Fatty liver disease, nonalcoholic    Fibrocystic breast changes    GERD (gastroesophageal reflux disease)    History of hiatal hernia    Hypercholesterolemia    MVP (mitral valve prolapse)    Osteoporosis    Rosacea, acne    Shoulder ankylosis    right   Thrombocytopenia ([facility])    Vitamin D deficiency     Past Surgical History:  Procedure Laterality Date   ABDOMINAL HYSTERECTOMY     APPENDECTOMY     COLONOSCOPY     COLONOSCOPY WITH PROPOFOL N/A 12/29/2015   Procedure: COLONOSCOPY WITH PROPOFOL;  Surgeon: Manya Silvas, MD;  Location: [place] [facility];  Service: Endoscopy;  Laterality: N/A;   ESOPHAGOGASTRODUODENOSCOPY     ESOPHAGOGASTRODUODENOSCOPY (EGD) WITH PROPOFOL N/A 12/29/2015   Procedure: ESOPHAGOGASTRODUODENOSCOPY (EGD) WITH PROPOFOL;  Surgeon: Manya Silvas, MD;  Location: [facility] ENDOSCOPY;  Service: Endoscopy;  Laterality: N/A;   FLEXIBLE SIGMOIDOSCOPY N/A 11/18/2015   Procedure: FLEXIBLE  SIGMOIDOSCOPY;  Surgeon: Manya Silvas, MD;  Location: [facility] ENDOSCOPY;  Service: Endoscopy;  Laterality: N/A;   HERNIA REPAIR     HIATAL HERNIA REPAIR N/A 12/19/2014   Procedure: LAPAROSCOPIC REPAIR OF HIATAL HERNIA;  Surgeon: Rochel Brome, MD;  Location: [facility] ORS;  Service: General;  Laterality: N/A;   SHOULDER SURGERY       Current Outpatient Medications:    albuterol (PROAIR HFA) 108 (90  Base) MCG/ACT inhaler, Inhale 1 puff into the lungs every 4 (four) hours as needed., Disp: , Rfl:    cyanocobalamin (,VITAMIN B-12,) 1000 MCG/ML injection, Inject 1 mL into the muscle every 30 (thirty) days., Disp: , Rfl:    denosumab (PROLIA) 60 MG/ML SOSY injection, Inject 60 mg into the skin every 6 (six) months., Disp: , Rfl:    doxycycline (VIBRAMYCIN) 50 MG capsule, Take 50 mg by mouth daily. Reported on 12/29/2015, Disp: , Rfl:    hydrocortisone 2.5 % cream, Apply 1 Application topically 2 (two) times daily., Disp: , Rfl:    ipratropium (ATROVENT) 0.06 % nasal spray, Place 2 sprays into the nose 3 (three) times daily., Disp: , Rfl:    metroNIDAZOLE (METROCREAM) 0.75 % cream, Apply 1 application  topically 2 (two) times daily., Disp: , Rfl:    Vitamin D, Ergocalciferol, (DRISDOL) 50000 UNITS CAPS capsule, Take 50,000 Units by mouth every 7 (seven) days. Take on Mondays., Disp: , Rfl:    Family History  Problem Relation Age of Onset   Breast cancer Maternal Aunt 58   Breast cancer Paternal Aunt 73   Breast cancer Maternal Grandmother 45   Breast cancer Cousin 43       mat cousin     Social History   Tobacco Use   Smoking status: Never   Smokeless tobacco: Never  Substance Use Topics   Alcohol use: Yes    Alcohol/week: 1.0 standard drink of alcohol    Types: 1 Shots of liquor per week   Drug use: No    Allergies as of 11/02/2022 - Review Complete 11/02/2022  Allergen Reaction Noted   Phenothiazines  11/16/2015   Compazine [prochlorperazine edisylate] Other (See Comments) 12/12/2014   Flagyl [metronidazole] Nausea And Vomiting 12/11/2014   Morphine and related Nausea And Vomiting 12/11/2014   Penicillins Hives and Rash 12/11/2014    Review of Systems:    All systems reviewed and negative except where noted in HPI.   Physical Exam:  BP (!) 170/79 (BP Location: Left Arm, Patient Position: Sitting, Cuff Size: Normal)   Pulse 93   Temp 98.2 F (36.8 C) (Oral)   Ht 5\' 6"   (1.676 m)   Wt 123 lb 8 oz (56 kg)   BMI 19.93 kg/m  No LMP recorded. Patient has had a hysterectomy.  General:   Alert,  Well-developed, well-nourished, pleasant and cooperative in NAD Head:  Normocephalic and atraumatic. Eyes:  Sclera clear, no icterus.   Conjunctiva pink. Ears:  Normal auditory acuity. Nose:  No deformity, discharge, or lesions. Mouth:  No deformity or lesions,oropharynx pink & moist. Neck:  Supple; no masses or thyromegaly. Lungs:  Respirations even and unlabored.  Clear throughout to auscultation.   No wheezes, crackles, or rhonchi. No acute distress. Heart:  Regular rate and rhythm; no murmurs, clicks, rubs, or gallops. Abdomen:  Normal bowel sounds. Soft, non-tender and non-distended without masses, hepatosplenomegaly or hernias noted.  No guarding or rebound tenderness.   Rectal: Not performed Msk:  Symmetrical without gross deformities. Good, equal movement &  strength bilaterally. Pulses:  Normal pulses noted. Extremities:  No clubbing or edema.  No cyanosis. Neurologic:  Alert and oriented x3;  grossly normal neurologically. Skin:  Intact without significant lesions or rashes. No jaundice. Psych:  Alert and cooperative. Normal mood and affect.  Imaging Studies: Reviewed  Assessment and Plan:   Jody Allen is a 72 y.o. pleasant Caucasian female with history of osteoporosis, on Prolia, history of fundoplication in Q000111Q followed by diagnosis of gastroparesis, s/p pyloroplasty in 2017, history of multiple adenomatous polyps of the colon is seen in consultation for symptoms of heartburn, predominantly nocturnal Trial of omeprazole 20 mg before meals, she is willing to try over-the-counter dose for 4 weeks.  If her symptoms are persistent, recommend upper endoscopy for further evaluation  Personal history of adenomatous polyps of the colon Recommend surveillance colonoscopy, patient is overdue for it  Follow up as needed   Cephas Darby, MD

## 2022-11-02 NOTE — Telephone Encounter (Signed)
Patient signed medical release form called [facility] where patient had colonoscopy and EGD done. To get the reports and path. Had to leave a message and they said to call [facility]. Called [facility] and the receptionist said she would fax [place] the information

## 2022-11-03 ENCOUNTER — Telehealth: Payer: Self-pay

## 2022-11-03 ENCOUNTER — Other Ambulatory Visit: Payer: Self-pay

## 2022-11-03 NOTE — Telephone Encounter (Signed)
Patient verbalized understanding of results. She states she is okay with adding the EGD to her colonoscopy. Called endo and talk to Wannetta Sender she will add on procedure to colonoscopy

## 2022-11-03 NOTE — Telephone Encounter (Signed)
-----   Message from Lin Landsman, MD sent at 11/03/2022  9:58 AM EDT ----- Please inform patient that I reviewed her upper endoscopy and colonoscopy reports from 2020 along with the pathology results.  I am glad she has scheduled colonoscopy which is appropriate because she had 2 polyps that were removed and those were precancerous.  Also, she had erosive esophagitis in 2020, with her ongoing symptoms of heartburn, recommend upper endoscopy along with a colonoscopy if patient is agreeable  Rohini Vanga

## 2022-11-26 ENCOUNTER — Encounter: Payer: Self-pay | Admitting: Gastroenterology

## 2022-12-02 ENCOUNTER — Encounter: Payer: Self-pay | Admitting: Gastroenterology

## 2022-12-02 ENCOUNTER — Telehealth: Payer: Self-pay

## 2022-12-02 NOTE — Telephone Encounter (Signed)
Spoken to patient and she wanted to confirm the time she should take her 2nd prep.

## 2022-12-02 NOTE — Telephone Encounter (Signed)
Pt has procedures on 04/19./2024 and has questions about prep directions please return call

## 2022-12-03 ENCOUNTER — Encounter
Admission: RE | Disposition: A | Discharge status: Home / Self Care | Payer: Self-pay | Source: Home / Self Care | Attending: Gastroenterology

## 2022-12-03 ENCOUNTER — Ambulatory Visit
Admission: RE | Admit: 2022-12-03 | Discharge: 2022-12-03 | Disposition: A | Discharge status: Home / Self Care | Payer: Medicare Other | Attending: Gastroenterology | Admitting: Gastroenterology

## 2022-12-03 ENCOUNTER — Other Ambulatory Visit: Payer: Self-pay | Admitting: Gastroenterology

## 2022-12-03 ENCOUNTER — Ambulatory Visit: Payer: Medicare Other | Admitting: Anesthesiology

## 2022-12-03 ENCOUNTER — Other Ambulatory Visit: Payer: Self-pay

## 2022-12-03 ENCOUNTER — Encounter: Payer: Self-pay | Admitting: Gastroenterology

## 2022-12-03 DIAGNOSIS — Z860100 Personal history of colon polyps, unspecified: Secondary | ICD-10-CM

## 2022-12-03 DIAGNOSIS — I341 Nonrheumatic mitral (valve) prolapse: Secondary | ICD-10-CM | POA: Diagnosis not present

## 2022-12-03 DIAGNOSIS — R12 Heartburn: Secondary | ICD-10-CM | POA: Diagnosis not present

## 2022-12-03 DIAGNOSIS — K635 Polyp of colon: Secondary | ICD-10-CM | POA: Diagnosis not present

## 2022-12-03 DIAGNOSIS — K319 Disease of stomach and duodenum, unspecified: Secondary | ICD-10-CM

## 2022-12-03 DIAGNOSIS — D122 Benign neoplasm of ascending colon: Secondary | ICD-10-CM | POA: Diagnosis not present

## 2022-12-03 DIAGNOSIS — K219 Gastro-esophageal reflux disease without esophagitis: Secondary | ICD-10-CM

## 2022-12-03 DIAGNOSIS — Z9889 Other specified postprocedural states: Secondary | ICD-10-CM | POA: Diagnosis not present

## 2022-12-03 DIAGNOSIS — K449 Diaphragmatic hernia without obstruction or gangrene: Secondary | ICD-10-CM | POA: Insufficient documentation

## 2022-12-03 DIAGNOSIS — Z1211 Encounter for screening for malignant neoplasm of colon: Secondary | ICD-10-CM | POA: Diagnosis present

## 2022-12-03 DIAGNOSIS — D120 Benign neoplasm of cecum: Secondary | ICD-10-CM | POA: Diagnosis not present

## 2022-12-03 DIAGNOSIS — K644 Residual hemorrhoidal skin tags: Secondary | ICD-10-CM | POA: Diagnosis not present

## 2022-12-03 DIAGNOSIS — K3189 Other diseases of stomach and duodenum: Secondary | ICD-10-CM | POA: Insufficient documentation

## 2022-12-03 HISTORY — PX: COLONOSCOPY WITH PROPOFOL: SHX5780

## 2022-12-03 HISTORY — PX: ESOPHAGOGASTRODUODENOSCOPY: SHX5428

## 2022-12-03 SURGERY — COLONOSCOPY WITH PROPOFOL
Anesthesia: General

## 2022-12-03 MED ORDER — PROPOFOL 1000 MG/100ML IV EMUL
INTRAVENOUS | Status: AC
  Filled 2022-12-03: qty 200

## 2022-12-03 MED ORDER — PROPOFOL 10 MG/ML IV BOLUS
INTRAVENOUS | Status: DC | PRN
  Administered 2022-12-03: 40 mg via INTRAVENOUS
  Administered 2022-12-03: 20 mg via INTRAVENOUS
  Administered 2022-12-03: 40 mg via INTRAVENOUS
  Administered 2022-12-03: 100 mg via INTRAVENOUS
  Administered 2022-12-03 (×3): 40 mg via INTRAVENOUS
  Administered 2022-12-03 (×2): 50 mg via INTRAVENOUS
  Administered 2022-12-03: 40 mg via INTRAVENOUS

## 2022-12-03 MED ORDER — SODIUM CHLORIDE 0.9 % IV SOLN: INTRAVENOUS | Status: DC

## 2022-12-03 MED ORDER — LIDOCAINE HCL (CARDIAC) PF 100 MG/5ML IV SOSY
PREFILLED_SYRINGE | INTRAVENOUS | Status: DC | PRN
  Administered 2022-12-03: 80 mg via INTRAVENOUS

## 2022-12-03 MED ORDER — PROPOFOL 10 MG/ML IV BOLUS
INTRAVENOUS | Status: AC
  Filled 2022-12-03: qty 40

## 2022-12-03 MED ORDER — OMEPRAZOLE 40 MG PO CPDR
40.0000 mg | DELAYED_RELEASE_CAPSULE | Freq: Two times a day (BID) | ORAL | 2 refills | Status: DC
Start: 2022-12-03 — End: 2023-04-05

## 2022-12-03 MED ORDER — LIDOCAINE HCL (PF) 2 % IJ SOLN
INTRAMUSCULAR | Status: AC
  Filled 2022-12-03: qty 15

## 2022-12-03 NOTE — Op Note (Signed)
[facility] Gastroenterology Patient Name: Jody Allen Procedure Date: 12/03/2022 7:45 AM MRN: [id number] Account #: [id number] Date of Birth: [birth year] Admit Type: Outpatient Age: 72 Room: [facility] ENDO ROOM 2 Gender: Female Note Status: Finalized Instrument Name: Peds Colonoscope [id number] Procedure:             Colonoscopy Indications:           High risk colon cancer surveillance: Personal history                         of colonic polyps, Surveillance: Personal history of                         adenomatous polyps on last colonoscopy 5 years ago,                         Last colonoscopy: May 2017 Providers:             Toney Reil MD, MD Referring MD:          Daniel Nones, MD (Referring MD) Medicines:             General Anesthesia Complications:         No immediate complications. Estimated blood loss: None. Procedure:             Pre-Anesthesia Assessment:                        - Prior to the procedure, a History and Physical was                         performed, and patient medications and allergies were                         reviewed. The patient is competent. The risks and                         benefits of the procedure and the sedation options and                         risks were discussed with the patient. All questions                         were answered and informed consent was obtained.                         Patient identification and proposed procedure were                         verified by the physician, the nurse, the                         anesthesiologist, the anesthetist and the technician                         in the pre-procedure area in the procedure room in the                         endoscopy suite. Mental Status Examination: alert and  oriented. Airway Examination: normal oropharyngeal                         airway and neck mobility. Respiratory Examination:                         clear to  auscultation. CV Examination: normal.                         Prophylactic Antibiotics: The patient does not require                         prophylactic antibiotics. Prior Anticoagulants: The                         patient has taken no anticoagulant or antiplatelet                         agents. ASA Grade Assessment: II - A patient with mild                         systemic disease. After reviewing the risks and                         benefits, the patient was deemed in satisfactory                         condition to undergo the procedure. The anesthesia                         plan was to use general anesthesia. Immediately prior                         to administration of medications, the patient was                         re-assessed for adequacy to receive sedatives. The                         heart rate, respiratory rate, oxygen saturations,                         blood pressure, adequacy of pulmonary ventilation, and                         response to care were monitored throughout the                         procedure. The physical status of the patient was                         re-assessed after the procedure.                        After obtaining informed consent, the colonoscope was                         passed under direct vision. Throughout the procedure,  the patient's blood pressure, pulse, and oxygen                         saturations were monitored continuously. The                         Colonoscope was introduced through the anus and                         advanced to the the terminal ileum, with                         identification of the appendiceal orifice and IC                         valve. The colonoscopy was performed without                         difficulty. The patient tolerated the procedure well.                         The quality of the bowel preparation was evaluated                         using the BBPS [facility]  Bowel Preparation Scale) with                         scores of: Right Colon = 3, Transverse Colon = 3 and                         Left Colon = 3 (entire mucosa seen well with no                         residual staining, small fragments of stool or opaque                         liquid). The total BBPS score equals 9. The terminal                         ileum, ileocecal valve, appendiceal orifice, and                         rectum were photographed. Findings:      The perianal and digital rectal examinations were normal. Pertinent       negatives include normal sphincter tone and no palpable rectal lesions.      The terminal ileum appeared normal.      Two sessile polyps were found in the ascending colon and cecum. The       polyps were 5 to 6 mm in size. These polyps were removed with a cold       snare. Resection and retrieval were complete. Estimated blood loss: none.      Non-bleeding external hemorrhoids were found during retroflexion. The       hemorrhoids were large. Impression:            - The examined portion of the ileum was normal.                        -  Two 5 to 6 mm polyps in the ascending colon and in                         the cecum, removed with a cold snare. Resected and                         retrieved.                        - Non-bleeding external hemorrhoids. Recommendation:        - Discharge patient to home (with escort).                        - Resume previous diet today.                        - Continue present medications.                        - Await pathology results.                        - Repeat colonoscopy in 5 years for surveillance based                         on pathology results. Procedure Code(s):     --- Professional ---                        [phone], Colonoscopy, flexible; with removal of                         tumor(s), polyp(s), or other lesion(s) by snare                         technique Diagnosis Code(s):     --- Professional  ---                        K64.4, Residual hemorrhoidal skin tags                        D12.2, Benign neoplasm of ascending colon                        D12.0, Benign neoplasm of cecum                        Z86.010, Personal history of colonic polyps CPT copyright 2022 American [employer]. All rights reserved. The codes documented in this report are preliminary and upon coder review may  be revised to meet current compliance requirements. Dr. Libby Maw Toney Reil MD, MD 12/03/2022 8:23:53 AM This report has been signed electronically. Number of Addenda: 0 Note Initiated On: 12/03/2022 7:45 AM Scope Withdrawal Time: 0 hours 10 minutes 55 seconds  Total Procedure Duration: 0 hours 15 minutes 56 seconds  Estimated Blood Loss:  Estimated blood loss: none.      [facility]

## 2022-12-03 NOTE — Op Note (Signed)
[facility] Gastroenterology Patient Name: Jody Allen Procedure Date: 12/03/2022 7:46 AM MRN: [id number] Account #: [id number] Date of Birth: 09-25-1950 Admit Type: Outpatient Age: 72 Room: [facility] ENDO ROOM 2 Gender: Female Note Status: Finalized Instrument Name: Upper Endoscope [id number] Procedure:             Upper GI endoscopy Indications:           Heartburn, Esophageal reflux symptoms that persist                         despite appropriate therapy Providers:             Toney Reil MD, MD Referring MD:          Daniel Nones, MD (Referring MD) Medicines:             General Anesthesia Complications:         No immediate complications. Estimated blood loss: None. Procedure:             Pre-Anesthesia Assessment:                        - Prior to the procedure, a History and Physical was                         performed, and patient medications and allergies were                         reviewed. The patient is competent. The risks and                         benefits of the procedure and the sedation options and                         risks were discussed with the patient. All questions                         were answered and informed consent was obtained.                         Patient identification and proposed procedure were                         verified by the physician, the nurse, the                         anesthesiologist, the anesthetist and the technician                         in the pre-procedure area in the procedure room in the                         endoscopy suite. Mental Status Examination: alert and                         oriented. Airway Examination: normal oropharyngeal                         airway and neck mobility. Respiratory Examination:  clear to auscultation. CV Examination: normal.                         Prophylactic Antibiotics: The patient does not require                          prophylactic antibiotics. Prior Anticoagulants: The                         patient has taken no anticoagulant or antiplatelet                         agents. ASA Grade Assessment: II - A patient with mild                         systemic disease. After reviewing the risks and                         benefits, the patient was deemed in satisfactory                         condition to undergo the procedure. The anesthesia                         plan was to use general anesthesia. Immediately prior                         to administration of medications, the patient was                         re-assessed for adequacy to receive sedatives. The                         heart rate, respiratory rate, oxygen saturations,                         blood pressure, adequacy of pulmonary ventilation, and                         response to care were monitored throughout the                         procedure. The physical status of the patient was                         re-assessed after the procedure.                        After obtaining informed consent, the endoscope was                         passed under direct vision. Throughout the procedure,                         the patient's blood pressure, pulse, and oxygen                         saturations were monitored continuously. The  Endosonoscope was introduced through the mouth, and                         advanced to the second part of duodenum. The upper GI                         endoscopy was accomplished without difficulty. The                         patient tolerated the procedure well. Findings:      The duodenal bulb and second portion of the duodenum were normal.      Evidence of a Nissen fundoplication was found in the gastric fundus and       at the pyloroplasty. The wrap appeared loose. This was traversed.      Diffuse mildly erythematous mucosa without bleeding was found in the       gastric body and in  the gastric antrum. Biopsies were taken with a cold       forceps for histology.      The cardia and gastric fundus were normal on retroflexion.      Esophagogastric landmarks were identified: the gastroesophageal junction       was found at 35 cm from the incisors.      The gastroesophageal junction and examined esophagus were normal.       Biopsies were taken with a cold forceps for histology.      A small hiatal hernia was present. Impression:            - Normal duodenal bulb and second portion of the                         duodenum.                        - A Nissen fundoplication was found. The wrap appears                         loose.                        - Erythematous mucosa in the gastric body and antrum.                         Biopsied.                        - Esophagogastric landmarks identified.                        - Normal gastroesophageal junction and esophagus.                         Biopsied.                        - Small hiatal hernia. Recommendation:        - Await pathology results.                        - Follow an antireflux regimen indefinitely.                        -  Use Prilosec (omeprazole) 40 mg PO BID for 8 weeks. Procedure Code(s):     --- Professional ---                        [phone], Esophagogastroduodenoscopy, flexible,                         transoral; with biopsy, single or multiple Diagnosis Code(s):     --- Professional ---                        [phone], Other specified postprocedural states                        K31.89, Other diseases of stomach and duodenum                        R12, Heartburn                        K21.9, Gastro-esophageal reflux disease without                         esophagitis CPT copyright 2022 American Medical Association. All rights reserved. The codes documented in this report are preliminary and upon coder review may  be revised to meet current compliance requirements. Dr. Libby Maw Toney Reil MD, MD 12/03/2022 8:02:45 AM This report has been signed electronically. Number of Addenda: 0 Note Initiated On: 12/03/2022 7:46 AM Estimated Blood Loss:  Estimated blood loss: none. Estimated blood loss: none.      [facility]

## 2022-12-03 NOTE — Transfer of Care (Signed)
Immediate Anesthesia Transfer of Care Note  Patient: Jody Allen  Procedure(s) Performed: COLONOSCOPY WITH PROPOFOL ESOPHAGOGASTRODUODENOSCOPY (EGD)  Patient Location: Endoscopy Unit  Anesthesia Type:General  Level of Consciousness: drowsy  Airway & Oxygen Therapy: Patient Spontanous Breathing and Patient connected to nasal cannula oxygen  Post-op Assessment: Report given to RN, Post -op Vital signs reviewed and stable, and Patient moving all extremities  Post vital signs: Reviewed and stable  Last Vitals:  Vitals Value Taken Time  BP 99/52 12/03/22 0824  Temp    Pulse 74 12/03/22 0823  Resp 18 12/03/22 0823  SpO2 98 % 12/03/22 0823  Vitals shown include unvalidated device data.  Last Pain:  Vitals:   12/03/22 0823  TempSrc:   PainSc: 0-No pain         Complications: No notable events documented.

## 2022-12-03 NOTE — Anesthesia Postprocedure Evaluation (Signed)
Anesthesia Post Note  Patient: Jody Allen  Procedure(s) Performed: COLONOSCOPY WITH PROPOFOL ESOPHAGOGASTRODUODENOSCOPY (EGD)  Patient location during evaluation: PACU Anesthesia Type: General Level of consciousness: awake and alert, oriented and patient cooperative Pain management: pain level controlled Vital Signs Assessment: post-procedure vital signs reviewed and stable Respiratory status: spontaneous breathing, nonlabored ventilation and respiratory function stable Cardiovascular status: blood pressure returned to baseline and stable Postop Assessment: adequate PO intake Anesthetic complications: no   No notable events documented.   Last Vitals:  Vitals:   12/03/22 0849 12/03/22 0855  BP:  130/70  Pulse: 74 81  Resp: 18 16  Temp:    SpO2:  99%    Last Pain:  Vitals:   12/03/22 0855  TempSrc:   PainSc: 0-No pain                 Reed Breech

## 2022-12-03 NOTE — H&P (Signed)
Arlyss Repress, MD [address]  [address]  [place], [place] [address]  Main: [phone]  Fax: [phone] Pager: [phone]  Primary Care Physician:  Lynnea Ferrier, MD Primary Gastroenterologist:  Dr. Arlyss Repress  Pre-Procedure History & Physical: HPI:  Jody Allen is a 72 y.o. female is here for an endoscopy and colonoscopy.   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, nose   Depression    history of ect   Fatty liver disease, nonalcoholic    Fibrocystic breast changes    Gastroesophageal reflux disease with esophagitis 07/24/2014   GERD (gastroesophageal reflux disease)    History of hiatal hernia    Hypercholesterolemia    MVP (mitral valve prolapse)    Osteoporosis    Rosacea, acne    Shoulder ankylosis    right   Thrombocytopenia    Vitamin D deficiency     Past Surgical History:  Procedure Laterality Date   ABDOMINAL HYSTERECTOMY     APPENDECTOMY     COLONOSCOPY     COLONOSCOPY WITH PROPOFOL N/A 12/29/2015   Procedure: COLONOSCOPY WITH PROPOFOL;  Surgeon: Scot Jun, MD;  Location: [facility] ENDOSCOPY;  Service: Endoscopy;  Laterality: N/A;   ESOPHAGOGASTRODUODENOSCOPY     ESOPHAGOGASTRODUODENOSCOPY (EGD) WITH PROPOFOL N/A 12/29/2015   Procedure: ESOPHAGOGASTRODUODENOSCOPY (EGD) WITH PROPOFOL;  Surgeon: Scot Jun, MD;  Location: [facility] ENDOSCOPY;  Service: Endoscopy;  Laterality: N/A;   FLEXIBLE SIGMOIDOSCOPY N/A 11/18/2015   Procedure: FLEXIBLE SIGMOIDOSCOPY;  Surgeon: Scot Jun, MD;  Location: [facility] ENDOSCOPY;  Service: Endoscopy;  Laterality: N/A;   HERNIA REPAIR     HIATAL HERNIA REPAIR N/A 12/19/2014   Procedure: LAPAROSCOPIC REPAIR OF HIATAL HERNIA;  Surgeon: Renda Rolls, MD;  Location: [facility] ORS;  Service: General;  Laterality: N/A;   pyeloroplasty  2017   SHOULDER SURGERY      Prior to Admission medications   Medication Sig Start Date End Date Taking? Authorizing  Provider  denosumab (PROLIA) 60 MG/ML SOSY injection Inject 60 mg into the skin every 6 (six) months.   Yes [provider]  doxycycline (VIBRAMYCIN) 50 MG capsule Take 20 mg by mouth 2 (two) times daily. Reported on 12/29/2015   Yes [provider]  ipratropium (ATROVENT) 0.06 % nasal spray Place 2 sprays into the nose 3 (three) times daily.   Yes [provider]  Vitamin D, Ergocalciferol, (DRISDOL) 50000 UNITS CAPS capsule Take 50,000 Units by mouth every 7 (seven) days. Take on Mondays.   Yes [provider]  albuterol (PROAIR HFA) 108 (90 Base) MCG/ACT inhaler Inhale 1 puff into the lungs every 4 (four) hours as needed. Patient not taking: Reported on 11/26/2022    [provider]  cyanocobalamin (,VITAMIN B-12,) 1000 MCG/ML injection Inject 1 mL into the muscle every 30 (thirty) days. 06/19/15   [provider]  hydrocortisone 2.5 % cream Apply 1 Application topically 2 (two) times daily. 06/08/22   [provider]  metroNIDAZOLE (METROCREAM) 0.75 % cream Apply 1 application  topically 2 (two) times daily.    [provider]    Allergies as of 11/02/2022 - Review Complete 11/02/2022  Allergen Reaction Noted   Phenothiazines  11/16/2015   Compazine [prochlorperazine edisylate] Other (See Comments) 12/12/2014   Flagyl [metronidazole] Nausea And Vomiting 12/11/2014   Morphine and related Nausea And Vomiting 12/11/2014   Penicillins Hives and Rash 12/11/2014  Family History  Problem Relation Age of Onset   Breast cancer Maternal Aunt 21   Breast cancer Paternal Aunt 24   Breast cancer Maternal Grandmother 87   Breast cancer Cousin 32       mat cousin    Social History   Socioeconomic History   Marital status: Married    Spouse name: Not on file   Number of children: Not on file   Years of education: Not on file   Highest education level: Not on file  Occupational History   Not on file  Tobacco Use    Smoking status: Never   Smokeless tobacco: Never  Vaping Use   Vaping Use: Never used  Substance and Sexual Activity   Alcohol use: Yes    Alcohol/week: 1.0 standard drink of alcohol    Types: 1 Shots of liquor per week   Drug use: No   Sexual activity: Never  Other Topics Concern   Not on file  Social History Narrative   Not on file   Social Determinants of Health   Financial Resource Strain: Not on file  Food Insecurity: Not on file  Transportation Needs: Not on file  Physical Activity: Not on file  Stress: Not on file  Social Connections: Not on file  Intimate Partner Violence: Not on file    Review of Systems: See HPI, otherwise negative ROS  Physical Exam: BP (!) 171/74   Pulse 81   Temp 97.7 F (36.5 C) (Temporal)   Resp 20   Ht  (1.651 m)   Wt 54.4 kg   SpO2 100%   BMI 19.97 kg/m  General:   Alert,  pleasant and cooperative in NAD Head:  Normocephalic and atraumatic. Neck:  Supple; no masses or thyromegaly. Lungs:  Clear throughout to auscultation.    Heart:  Regular rate and rhythm. Abdomen:  Soft, nontender and nondistended. Normal bowel sounds, without guarding, and without rebound.   Neurologic:  Alert and  oriented x4;  grossly normal neurologically.  Impression/Plan: Jody Allen is here for an endoscopy and colonoscopy to be performed for nocturnal heart burn, h/o adenomatous polyps  Risks, benefits, limitations, and alternatives regarding  endoscopy and colonoscopy have been reviewed with the patient.  Questions have been answered.  All parties agreeable.   Lannette Donath, MD  12/03/2022, 7:33 AM

## 2022-12-03 NOTE — Anesthesia Preprocedure Evaluation (Addendum)
Anesthesia Evaluation  Patient identified by MRN, date of birth, ID band Patient awake    Reviewed: Allergy & Precautions, NPO status , Patient's Chart, lab work & pertinent test results  History of Anesthesia Complications Negative for: history of anesthetic complications  Airway Mallampati: III   Neck ROM: Full    Dental no notable dental hx.    Pulmonary neg pulmonary ROS   Pulmonary exam normal breath sounds clear to auscultation       Cardiovascular Exercise Tolerance: Good Normal cardiovascular exam+ Valvular Problems/Murmurs MVP  Rhythm:Regular Rate:Normal     Neuro/Psych  PSYCHIATRIC DISORDERS  Depression    HOH    GI/Hepatic hiatal hernia,GERD  ,,NAFLD   Endo/Other  negative endocrine ROS    Renal/GU negative Renal ROS     Musculoskeletal   Abdominal   Peds  Hematology negative hematology ROS (+)   Anesthesia Other Findings   Reproductive/Obstetrics                             Anesthesia Physical Anesthesia Plan  ASA: 2  Anesthesia Plan: General   Post-op Pain Management:    Induction: Intravenous  PONV Risk Score and Plan: 3 and Propofol infusion, TIVA and Treatment may vary due to age or medical condition  Airway Management Planned: Natural Airway  Additional Equipment:   Intra-op Plan:   Post-operative Plan:   Informed Consent: I have reviewed the patients History and Physical, chart, labs and discussed the procedure including the risks, benefits and alternatives for the proposed anesthesia with the patient or authorized representative who has indicated his/her understanding and acceptance.       Plan Discussed with: CRNA  Anesthesia Plan Comments: (LMA/GETA backup discussed.  Patient consented for risks of anesthesia including but not limited to:  - adverse reactions to medications - damage to eyes, teeth, lips or other oral mucosa - nerve damage due  to positioning  - sore throat or hoarseness - damage to heart, brain, nerves, lungs, other parts of body or loss of life  Informed patient about role of CRNA in peri- and intra-operative care.  Patient voiced understanding.)       Anesthesia Quick Evaluation

## 2022-12-06 ENCOUNTER — Encounter: Payer: Self-pay | Admitting: Gastroenterology

## 2022-12-06 LAB — SURGICAL PATHOLOGY

## 2022-12-16 ENCOUNTER — Telehealth: Payer: Self-pay

## 2022-12-16 NOTE — Telephone Encounter (Signed)
Patient states that you did a colonoscopy on her a couple weeks ago and you mention to her that you recommend that she come in to the office to discuss her hemorrhoids. Is this something that needs to be overbook or what do you recommend.

## 2022-12-16 NOTE — Telephone Encounter (Signed)
Made appointment 01/12/2023

## 2022-12-16 NOTE — Telephone Encounter (Signed)
Yes, in 2-3 weeks is fine  RV

## 2023-01-12 ENCOUNTER — Other Ambulatory Visit: Payer: Self-pay

## 2023-01-12 ENCOUNTER — Ambulatory Visit: Payer: Medicare Other | Admitting: Gastroenterology

## 2023-01-12 ENCOUNTER — Encounter: Payer: Self-pay | Admitting: Gastroenterology

## 2023-01-12 VITALS — BP 175/76 | HR 80 | Temp 98.7°F | Ht 66.0 in | Wt 124.1 lb

## 2023-01-12 DIAGNOSIS — K641 Second degree hemorrhoids: Secondary | ICD-10-CM

## 2023-01-12 NOTE — Progress Notes (Signed)
Arlyss Repress, MD [address]  [address]  [place], [place] [address]  Main: [phone]  Fax: [phone]    Gastroenterology Consultation  Referring Provider:     Lynnea Ferrier, MD Primary Care Physician:  Lynnea Ferrier, MD Primary Gastroenterologist:  Dr. Arlyss Repress Reason for Consultation: Symptomatic hemorrhoids        HPI:   Jody Allen is a 72 y.o. female referred by Dr. Graciela Husbands, Viona Gilmore, MD  for consultation & management of dyspepsia.  Patient states that she has been experiencing symptoms of burning in her chest, regurgitation of food, epigastric discomfort.  She reports severe heartburn at night that wakes her up from sleep associated with cough.  She had history of Nissen's fundoplication in 2016 followed by diagnosis of gastroparesis in 2017 and underwent pyloroplasty.  Since then, she has been following small frequent meals and follows healthy diet.  She also reports some abdominal bloating.  She denies any difficulty swallowing.  She is not on any PPI or H2 blocker.  She takes Mylanta as needed only.  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  Her last EGD was by Dr. Mechele Collin at [facility] after abnormal CT abdomen and pelvis in 08/2018.  It was reportedly normal.  Follow-up visit 01/12/2023 Patient is here today discuss about hemorrhoid banding.  She underwent colonoscopy and found to have large hemorrhoids.  She has been experiencing hemorrhoidal symptoms including rectal bleeding, leakage, discomfort as well as prolapse, itching  NSAIDs: None  Antiplts/Anticoagulants/Anti thrombotics: None  GI Procedures:  Upper endoscopy 09/16/2014 DIAGNOSIS:  A. STOMACH, NOS; MUCOSAL BIOPSIES:  - CHRONIC SUPERFICIAL GASTRITIS, INACTIVE.  - NO HELICOBACTER PYLORI ORGANISMS OR DYSPLASIA SEEN.  - SEE COMMENT.   B.  ESOPHAGUS, ULCER; MUCOSAL BIOPSIES:  - ACTIVE ESOPHAGITIS WITH SUPERFICIAL EROSIONS.  - NO GLANDULAR EPITHELIUM,  VIRAL INCLUSIONS, OR DYSPLASIA SEEN.  - GMS STAIN FOR FUNGAL ORGANISMS IS NEGATIVE.   Colonoscopy 12/29/2015 DIAGNOSIS: A. COLON POLYP, TRANSVERSE; HOT SNARE AND COLD BIOPSY: - TUBULAR ADENOMA, 5 FRAGMENTS. - NEGATIVE FOR HIGH-GRADE DYSPLASIA AND MALIGNANCY.  B. COLON, CECUM; COLD BIOPSY: - COLONIC MUCOSA NEGATIVE FOR MICROSCOPIC COLITIS, DYSPLASIA AND MALIGNANCY.  C. COLON, TRANSVERSE; COLD BIOPSY: - COLONIC MUCOSA NEGATIVE FOR MICROSCOPIC COLITIS, DYSPLASIA AND MALIGNANCY.  D. COLON POLYP, TRANSVERSE; HOT SNARE: - TUBULAR ADENOMA, 3 FRAGMENTS. - NEGATIVE FOR HIGH-GRADE DYSPLASIA AND MALIGNANCY.  E. COLON, DESCENDING AND SIGMOID; COLD BIOPSY: - COLONIC MUCOSA NEGATIVE FOR MICROSCOPIC COLITIS, DYSPLASIA AND MALIGNANCY.   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, nose   Depression    history of ect   Fatty liver disease, nonalcoholic    Fibrocystic breast changes    Gastroesophageal reflux disease with esophagitis 07/24/2014   GERD (gastroesophageal reflux disease)    History of hiatal hernia    Hypercholesterolemia    MVP (mitral valve prolapse)    Osteoporosis    Rosacea, acne    Shoulder ankylosis    right   Thrombocytopenia (HCC)    Vitamin D deficiency     Past Surgical History:  Procedure Laterality Date   ABDOMINAL HYSTERECTOMY     APPENDECTOMY     COLONOSCOPY     COLONOSCOPY WITH PROPOFOL N/A 12/29/2015   Procedure: COLONOSCOPY WITH PROPOFOL;  Surgeon: Scot Jun, MD;  Location: [facility] ENDOSCOPY;  Service: Endoscopy;  Laterality: N/A;   COLONOSCOPY WITH PROPOFOL N/A 12/03/2022   Procedure: COLONOSCOPY WITH PROPOFOL;  Surgeon: Toney Reil, MD;  Location: [facility] ENDOSCOPY;  Service: Gastroenterology;  Laterality: N/A;   ESOPHAGOGASTRODUODENOSCOPY     ESOPHAGOGASTRODUODENOSCOPY N/A 12/03/2022   Procedure: ESOPHAGOGASTRODUODENOSCOPY (EGD);  Surgeon: Toney Reil, MD;  Location: [facility]  ENDOSCOPY;  Service: Gastroenterology;  Laterality: N/A;   ESOPHAGOGASTRODUODENOSCOPY (EGD) WITH PROPOFOL N/A 12/29/2015   Procedure: ESOPHAGOGASTRODUODENOSCOPY (EGD) WITH PROPOFOL;  Surgeon: Scot Jun, MD;  Location: [facility] ENDOSCOPY;  Service: Endoscopy;  Laterality: N/A;   FLEXIBLE SIGMOIDOSCOPY N/A 11/18/2015   Procedure: FLEXIBLE SIGMOIDOSCOPY;  Surgeon: Scot Jun, MD;  Location: [facility] ENDOSCOPY;  Service: Endoscopy;  Laterality: N/A;   HERNIA REPAIR     HIATAL HERNIA REPAIR N/A 12/19/2014   Procedure: LAPAROSCOPIC REPAIR OF HIATAL HERNIA;  Surgeon: Renda Rolls, MD;  Location: [facility] ORS;  Service: General;  Laterality: N/A;   pyeloroplasty  2017   SHOULDER SURGERY       Current Outpatient Medications:    cyanocobalamin (,VITAMIN B-12,) 1000 MCG/ML injection, Inject 1 mL into the muscle every 30 (thirty) days., Disp: , Rfl:    denosumab (PROLIA) 60 MG/ML SOSY injection, Inject 60 mg into the skin every 6 (six) months., Disp: , Rfl:    hydrocortisone 2.5 % cream, Apply 1 Application topically 2 (two) times daily., Disp: , Rfl:    ipratropium (ATROVENT) 0.06 % nasal spray, Place 2 sprays into the nose 3 (three) times daily., Disp: , Rfl:    metroNIDAZOLE (METROCREAM) 0.75 % cream, Apply 1 application  topically 2 (two) times daily., Disp: , Rfl:    omeprazole (PRILOSEC) 40 MG capsule, Take 1 capsule (40 mg total) by mouth 2 (two) times daily before a meal., Disp: 60 capsule, Rfl: 2   Vitamin D, Ergocalciferol, (DRISDOL) 50000 UNITS CAPS capsule, Take 50,000 Units by mouth every 7 (seven) days. Take on Mondays., Disp: , Rfl:    Family History  Problem Relation Age of Onset   Breast cancer Maternal Aunt 48   Breast cancer Paternal Aunt 61   Breast cancer Maternal Grandmother 45   Breast cancer Cousin 32       mat cousin     Social History   Tobacco Use   Smoking status: Never   Smokeless tobacco: Never  Vaping Use   Vaping Use: Never used  Substance Use Topics    Alcohol use: Yes    Alcohol/week: 1.0 standard drink of alcohol    Types: 1 Shots of liquor per week   Drug use: No    Allergies as of 01/12/2023 - Review Complete 01/12/2023  Allergen Reaction Noted   Phenothiazines  11/16/2015   Compazine [prochlorperazine edisylate] Other (See Comments) 12/12/2014   Flagyl [metronidazole] Nausea And Vomiting 12/11/2014   Morphine and codeine Nausea And Vomiting 12/11/2014   Penicillins Hives and Rash 12/11/2014    Review of Systems:    All systems reviewed and negative except where noted in HPI.   Physical Exam:  BP (!) 175/76 (BP Location: Right Arm, Patient Position: Sitting, Cuff Size: Normal)   Pulse 80   Temp 98.7 F (37.1 C) (Oral)   Ht 5\' 6"  (1.676 m)   Wt 124 lb 2 oz (56.3 kg)   BMI 20.03 kg/m  No LMP recorded. Patient has had a hysterectomy.  General:   Alert,  Well-developed, well-nourished, pleasant and cooperative in NAD Head:  Normocephalic and atraumatic. Eyes:  Sclera clear, no icterus.   Conjunctiva  pink. Ears:  Normal auditory acuity. Nose:  No deformity, discharge, or lesions. Mouth:  No deformity or lesions,oropharynx pink & moist. Neck:  Supple; no masses or thyromegaly. Lungs:  Respirations even and unlabored.  Clear throughout to auscultation.   No wheezes, crackles, or rhonchi. No acute distress. Heart:  Regular rate and rhythm; no murmurs, clicks, rubs, or gallops. Abdomen:  Normal bowel sounds. Soft, non-tender and non-distended without masses, hepatosplenomegaly or hernias noted.  No guarding or rebound tenderness.   Rectal: Not performed Msk:  Symmetrical without gross deformities. Good, equal movement & strength bilaterally. Pulses:  Normal pulses noted. Extremities:  No clubbing or edema.  No cyanosis. Neurologic:  Alert and oriented x3;  grossly normal neurologically. Skin:  Intact without significant lesions or rashes. No jaundice. Psych:  Alert and cooperative. Normal mood and affect.  Imaging  Studies: Reviewed  Assessment and Plan:   Jody Allen is a 72 y.o. pleasant Caucasian female with history of osteoporosis, on Prolia, history of fundoplication in 2016 followed by diagnosis of gastroparesis, s/p pyloroplasty in 2017, history of multiple adenomatous polyps of the colon  Grade 2 external hemorrhoids Discussed about outpatient hemorrhoid ligation, procedure, risks and benefits Consent obtained Proceed with hemorrhoid ligation today  Follow up in 2 weeks   Arlyss Repress, MD

## 2023-01-12 NOTE — Progress Notes (Signed)

## 2023-01-26 ENCOUNTER — Encounter: Payer: Self-pay | Admitting: Gastroenterology

## 2023-01-26 ENCOUNTER — Ambulatory Visit: Payer: Medicare Other | Admitting: Gastroenterology

## 2023-01-26 VITALS — BP 189/70 | HR 79 | Temp 97.8°F | Ht 66.0 in | Wt 121.5 lb

## 2023-01-26 DIAGNOSIS — K641 Second degree hemorrhoids: Secondary | ICD-10-CM | POA: Diagnosis not present

## 2023-01-26 NOTE — Progress Notes (Signed)
PROCEDURE NOTE: The patient presents with symptomatic grade 2 hemorrhoids, unresponsive to maximal medical therapy, requesting rubber band ligation of her hemorrhoidal disease.  All risks, benefits and alternative forms of therapy were described and informed consent was obtained.  Patient had mild rectal pressure after banding which subsided the following day.  For last 2 days, she has been experiencing mild tenderness with prolapsed hemorrhoid. Perianal exam revealed thrombosed right posterior external hemorrhoid, mildly tender, clot is organized.  Nontender digital rectal exam.  Band was still felt from previous banding of the right posterior hemorrhoid.  The decision was made to band the LL internal hemorrhoid, and the [facility] O'Regan System was used to perform band ligation without complication.  Digital anorectal examination was then performed to assure proper positioning of the band, and to adjust the banded tissue as required.  The patient was discharged home without pain or other issues.  Dietary and behavioral recommendations were given and (if necessary - prescriptions were given), along with follow-up instructions.  The patient will return 4 weeks for follow-up and possible additional banding as required.  No complications were encountered and the patient tolerated the procedure well.  Advised patient to have sitz bath's, which hazel.  Not to use any hemorrhoidal creams per rectum  Arlyss Repress, MD [place], [facility] [address]  [address]  [place], [place] [address]  Main: [phone]  Fax: [phone] Pager: [phone]

## 2023-01-27 ENCOUNTER — Encounter: Payer: Self-pay | Admitting: Gastroenterology

## 2023-03-22 ENCOUNTER — Ambulatory Visit: Payer: Medicare Other | Admitting: Gastroenterology

## 2023-03-22 ENCOUNTER — Encounter: Payer: Self-pay | Admitting: Gastroenterology

## 2023-03-22 VITALS — BP 172/76 | HR 76 | Temp 97.9°F | Ht 66.0 in | Wt 122.1 lb

## 2023-03-22 DIAGNOSIS — K641 Second degree hemorrhoids: Secondary | ICD-10-CM

## 2023-03-22 NOTE — Progress Notes (Signed)
PROCEDURE NOTE: The patient presents with symptomatic grade 2 hemorrhoids, unresponsive to maximal medical therapy, requesting rubber band ligation of her hemorrhoidal disease.  All risks, benefits and alternative forms of therapy were described and informed consent was obtained.  The decision was made to band the RA internal hemorrhoid, and the [facility] O'Regan System was used to perform band ligation without complication.  Digital anorectal examination was then performed to assure proper positioning of the band, and to adjust the banded tissue as required.  The patient was discharged home without pain or other issues.  Dietary and behavioral recommendations were given and (if necessary - prescriptions were given), along with follow-up instructions.  The patient will return 4 weeks for follow-up and possible additional banding as required.  No complications were encountered and the patient tolerated the procedure well.  Advised patient to continue sitz bath's.  Not to use any hemorrhoidal creams per rectum  Arlyss Repress, MD [place], [facility] [address]  [address]  [place], [place] [address]  Main: [phone]  Fax: [phone] Pager: [phone]

## 2023-04-01 ENCOUNTER — Other Ambulatory Visit: Payer: Self-pay | Admitting: Gastroenterology

## 2023-04-01 DIAGNOSIS — K219 Gastro-esophageal reflux disease without esophagitis: Secondary | ICD-10-CM

## 2023-04-05 ENCOUNTER — Other Ambulatory Visit: Payer: Self-pay | Admitting: Gastroenterology

## 2023-04-05 DIAGNOSIS — K219 Gastro-esophageal reflux disease without esophagitis: Secondary | ICD-10-CM

## 2023-04-06 MED ORDER — OMEPRAZOLE 40 MG PO CPDR
40.0000 mg | DELAYED_RELEASE_CAPSULE | Freq: Two times a day (BID) | ORAL | 3 refills | Status: AC
Start: 2023-04-06 — End: 2024-06-25

## 2023-04-15 ENCOUNTER — Other Ambulatory Visit: Payer: Self-pay

## 2023-04-19 ENCOUNTER — Ambulatory Visit: Payer: Medicare Other | Admitting: Gastroenterology

## 2023-04-19 ENCOUNTER — Encounter: Payer: Self-pay | Admitting: Gastroenterology

## 2023-04-19 VITALS — BP 147/69 | HR 71 | Temp 97.7°F | Ht 66.0 in | Wt 121.2 lb

## 2023-04-19 DIAGNOSIS — K449 Diaphragmatic hernia without obstruction or gangrene: Secondary | ICD-10-CM | POA: Diagnosis not present

## 2023-04-19 DIAGNOSIS — K641 Second degree hemorrhoids: Secondary | ICD-10-CM

## 2023-04-19 DIAGNOSIS — K219 Gastro-esophageal reflux disease without esophagitis: Secondary | ICD-10-CM

## 2023-04-19 DIAGNOSIS — K2100 Gastro-esophageal reflux disease with esophagitis, without bleeding: Secondary | ICD-10-CM | POA: Diagnosis not present

## 2023-04-19 NOTE — Progress Notes (Signed)
Jody Repress, MD [address]  [address]  [place], [place] [address]  Main: [phone]  Fax: [phone]    Gastroenterology Consultation  Referring Provider:     Lynnea Ferrier, MD Primary Care Physician:  Lynnea Ferrier, MD Primary Gastroenterologist:  Dr. Arlyss Allen Reason for Consultation: Symptomatic hemorrhoids, Chronic GERD        HPI:   Jody Allen is a 72 y.o. female referred by Dr. Graciela Husbands, Viona Gilmore, MD  for consultation & management of dyspepsia.  Patient states that she has been experiencing symptoms of burning in her chest, regurgitation of food, epigastric discomfort.  She reports severe heartburn at night that wakes her up from sleep associated with cough.  She had history of Nissen's fundoplication in 2016 followed by diagnosis of gastroparesis in 2017 and underwent pyloroplasty.  Since then, she has been following small frequent meals and follows healthy diet.  She also reports some abdominal bloating.  She denies any difficulty swallowing.  She is not on any PPI or H2 blocker.  She takes Mylanta as needed only.  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  Her last EGD was by Dr. Mechele Collin at [facility] after abnormal CT abdomen and pelvis in 08/2018.  It was reportedly normal.  Follow-up visit 01/12/2023 Patient is here today discuss about hemorrhoid banding.  She underwent colonoscopy and found to have large hemorrhoids.  She has been experiencing hemorrhoidal symptoms including rectal bleeding, leakage, discomfort as well as prolapse, itching  Follow-up visit 04/19/2023 Patient is here for follow-up of symptomatic hemorrhoids as well as chronic GERD.  She reports significant improvement in her hemorrhoidal symptoms.  Thrombosed external hemorrhoid has healed.  She has chronic GERD and has been taking omeprazole 40 mg twice daily for about 6 weeks and her symptoms are under control.  She had an EGD on 12/13/2022 which  revealed small hiatal hernia, fundoplication  NSAIDs: None  Antiplts/Anticoagulants/Anti thrombotics: None  GI Procedures:  Upper endoscopy and colonoscopy 12/03/2022 - Normal duodenal bulb and second portion of the duodenum. - A Nissen fundoplication was found. The wrap appears loose. - Erythematous mucosa in the gastric body and antrum. Biopsied. - Esophagogastric landmarks identified. - Normal gastroesophageal junction and esophagus. Biopsied. - Small hiatal hernia.  - Two 5 to 6 mm polyps in the ascending colon and in the cecum, removed with a cold snare. Resected and retrieved. - Non- bleeding external hemorrhoids.  DIAGNOSIS: A. STOMACH; COLD BIOPSY: - CHRONIC GASTRITIS WITH PATCHY ACTIVE MUCOSAL INFLAMMATION. - NEGATIVE FOR H. PYLORI, DYSPLASIA, AND MALIGNANCY.  Comment: Given the presence of active mucosal inflammation, an immunohistochemical study directed against H. pylori was performed. This study is negative for Helicobacter pylori-type organisms.  B. ESOPHAGUS; COLD BIOPSY: - BENIGN SQUAMOUS MUCOSA WITH FEATURES OF MILD REFLUX ESOPHAGITIS. - NO INCREASE IN INTRAEPITHELIAL EOSINOPHILS (LESS THAN 2 PER HPF). - NEGATIVE FOR DYSPLASIA AND MALIGNANCY.  C. COLON POLYP, CECUM; COLD SNARE: - TUBULAR ADENOMA. - NEGATIVE FOR HIGH-GRADE DYSPLASIA AND MALIGNANCY.  D. COLON POLYP, ASCENDING; COLD SNARE: - TUBULAR ADENOMA. - NEGATIVE FOR HIGH-GRADE DYSPLASIA AND MALIGNANCY.   Upper endoscopy 09/16/2014 DIAGNOSIS:  A. STOMACH, NOS; MUCOSAL BIOPSIES:  - CHRONIC SUPERFICIAL GASTRITIS, INACTIVE.  - NO HELICOBACTER PYLORI ORGANISMS OR DYSPLASIA SEEN.  - SEE COMMENT.   B.  ESOPHAGUS, ULCER; MUCOSAL BIOPSIES:  - ACTIVE ESOPHAGITIS WITH SUPERFICIAL EROSIONS.  - NO GLANDULAR EPITHELIUM, VIRAL INCLUSIONS, OR DYSPLASIA  SEEN.  - GMS STAIN FOR FUNGAL ORGANISMS IS NEGATIVE.   Colonoscopy 12/29/2015 DIAGNOSIS: A. COLON POLYP, TRANSVERSE; HOT SNARE AND COLD BIOPSY: - TUBULAR ADENOMA, 5  FRAGMENTS. - NEGATIVE FOR HIGH-GRADE DYSPLASIA AND MALIGNANCY.  B. COLON, CECUM; COLD BIOPSY: - COLONIC MUCOSA NEGATIVE FOR MICROSCOPIC COLITIS, DYSPLASIA AND MALIGNANCY.  C. COLON, TRANSVERSE; COLD BIOPSY: - COLONIC MUCOSA NEGATIVE FOR MICROSCOPIC COLITIS, DYSPLASIA AND MALIGNANCY.  D. COLON POLYP, TRANSVERSE; HOT SNARE: - TUBULAR ADENOMA, 3 FRAGMENTS. - NEGATIVE FOR HIGH-GRADE DYSPLASIA AND MALIGNANCY.  E. COLON, DESCENDING AND SIGMOID; COLD BIOPSY: - COLONIC MUCOSA NEGATIVE FOR MICROSCOPIC COLITIS, DYSPLASIA AND MALIGNANCY.   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, nose   Depression    history of ect   Fatty liver disease, nonalcoholic    Fibrocystic breast changes    Gastroesophageal reflux disease with esophagitis 07/24/2014   GERD (gastroesophageal reflux disease)    History of hiatal hernia    Hypercholesterolemia    MVP (mitral valve prolapse)    Osteoporosis    Rosacea, acne    Shoulder ankylosis    right   Thrombocytopenia (HCC)    Vitamin D deficiency     Past Surgical History:  Procedure Laterality Date   ABDOMINAL HYSTERECTOMY     APPENDECTOMY     COLONOSCOPY     COLONOSCOPY WITH PROPOFOL N/A 12/29/2015   Procedure: COLONOSCOPY WITH PROPOFOL;  Surgeon: Scot Jun, MD;  Location: [facility] ENDOSCOPY;  Service: Endoscopy;  Laterality: N/A;   COLONOSCOPY WITH PROPOFOL N/A 12/03/2022   Procedure: COLONOSCOPY WITH PROPOFOL;  Surgeon: Toney Reil, MD;  Location: [facility] ENDOSCOPY;  Service: Gastroenterology;  Laterality: N/A;   ESOPHAGOGASTRODUODENOSCOPY     ESOPHAGOGASTRODUODENOSCOPY N/A 12/03/2022   Procedure: ESOPHAGOGASTRODUODENOSCOPY (EGD);  Surgeon: Toney Reil, MD;  Location: [facility] ENDOSCOPY;  Service: Gastroenterology;  Laterality: N/A;   ESOPHAGOGASTRODUODENOSCOPY (EGD) WITH PROPOFOL N/A 12/29/2015   Procedure: ESOPHAGOGASTRODUODENOSCOPY (EGD) WITH PROPOFOL;  Surgeon: Scot Jun, MD;  Location: [facility] ENDOSCOPY;  Service: Endoscopy;  Laterality: N/A;   FLEXIBLE SIGMOIDOSCOPY N/A 11/18/2015   Procedure: FLEXIBLE SIGMOIDOSCOPY;  Surgeon: Scot Jun, MD;  Location: [facility] ENDOSCOPY;  Service: Endoscopy;  Laterality: N/A;   HERNIA REPAIR     HIATAL HERNIA REPAIR N/A 12/19/2014   Procedure: LAPAROSCOPIC REPAIR OF HIATAL HERNIA;  Surgeon: Renda Rolls, MD;  Location: [facility] ORS;  Service: General;  Laterality: N/A;   pyeloroplasty  2017   SHOULDER SURGERY       Current Outpatient Medications:    cyanocobalamin (,VITAMIN B-12,) 1000 MCG/ML injection, Inject 1 mL into the muscle every 30 (thirty) days., Disp: , Rfl:    denosumab (PROLIA) 60 MG/ML SOSY injection, Inject 60 mg into the skin every 6 (six) months., Disp: , Rfl:    doxycycline (PERIOSTAT) 20 MG tablet, Take 20 mg by mouth 2 (two) times daily., Disp: , Rfl:    hydrocortisone 2.5 % cream, Apply 1 Application topically 2 (two) times daily., Disp: , Rfl:    ipratropium (ATROVENT) 0.06 % nasal spray, Place 2 sprays into the nose 3 (three) times daily., Disp: , Rfl:    metroNIDAZOLE (METROCREAM) 0.75 % cream, Apply 1 application  topically 2 (two) times daily., Disp: , Rfl:    omeprazole (PRILOSEC) 40 MG capsule, Take 1 capsule (40 mg total) by mouth 2 (two) times daily before a meal., Disp: 180 capsule, Rfl: 3   Vitamin D, Ergocalciferol, (DRISDOL)  50000 UNITS CAPS capsule, Take 50,000 Units by mouth every 7 (seven) days. Take on Mondays., Disp: , Rfl:    Family History  Problem Relation Age of Onset   Breast cancer Maternal Aunt 44   Breast cancer Paternal Aunt 49   Breast cancer Maternal Grandmother 45   Breast cancer Cousin 32       mat cousin     Social History   Tobacco Use   Smoking status: Never   Smokeless tobacco: Never  Vaping Use   Vaping status: Never Used  Substance Use Topics   Alcohol use: Yes    Alcohol/week: 1.0 standard drink of alcohol    Types: 1 Shots of liquor per week    Drug use: No    Allergies as of 04/19/2023 - Review Complete 04/19/2023  Allergen Reaction Noted   Phenothiazines  11/16/2015   Flagyl [metronidazole] Nausea And Vomiting 12/11/2014   Morphine and codeine Nausea And Vomiting 12/11/2014   Penicillins Hives and Rash 12/11/2014    Review of Systems:    All systems reviewed and negative except where noted in HPI.   Physical Exam:  BP (!) 147/69 (BP Location: Right Arm, Patient Position: Sitting, Cuff Size: Normal)   Pulse 71   Temp 97.7 F (36.5 C) (Oral)   Ht 5\' 6"  (1.676 m)   Wt 121 lb 4 oz (55 kg)   BMI 19.57 kg/m  No LMP recorded. Patient has had a hysterectomy.  General:   Alert,  Well-developed, well-nourished, pleasant and cooperative in NAD Head:  Normocephalic and atraumatic. Eyes:  Sclera clear, no icterus.   Conjunctiva pink. Ears:  Normal auditory acuity. Nose:  No deformity, discharge, or lesions. Mouth:  No deformity or lesions,oropharynx pink & moist. Neck:  Supple; no masses or thyromegaly. Lungs:  Respirations even and unlabored.  Clear throughout to auscultation.   No wheezes, crackles, or rhonchi. No acute distress. Heart:  Regular rate and rhythm; no murmurs, clicks, rubs, or gallops. Abdomen:  Normal bowel sounds. Soft, non-tender and non-distended without masses, hepatosplenomegaly or hernias noted.  No guarding or rebound tenderness.   Rectal: Not performed Msk:  Symmetrical without gross deformities. Good, equal movement & strength bilaterally. Pulses:  Normal pulses noted. Extremities:  No clubbing or edema.  No cyanosis. Neurologic:  Alert and oriented x3;  grossly normal neurologically. Skin:  Intact without significant lesions or rashes. No jaundice. Psych:  Alert and cooperative. Normal mood and affect.  Imaging Studies: Reviewed  Assessment and Plan:   ARYKA TREECE is a 72 y.o. pleasant Caucasian female with history of osteoporosis, on Prolia, history of fundoplication in 2016 followed by  diagnosis of gastroparesis, s/p pyloroplasty in 2017, history of multiple adenomatous polyps of the colon, chronic GERD, symptomatic hemorrhoids  Chronic GERD EGD in 4/24 revealed loose tap from past history of Nissen's fundoplication Small hiatal hernia Esophageal biopsies revealed reflux esophagitis only Advised patient to continue antireflux lifestyle Currently on omeprazole 40 mg twice daily before meals for last 8 weeks or so, decrease to 40 mg once a day before breakfast or before dinner and Pepcid as needed.  Advised her to stay on the lowest dose of omeprazole possible  Grade 2 external hemorrhoids S/p outpatient hemorrhoid ligation x 3, symptoms have currently resolved   Follow up as needed   Jody Repress, MD

## 2023-04-26 ENCOUNTER — Other Ambulatory Visit: Payer: Self-pay | Admitting: Internal Medicine

## 2023-04-26 DIAGNOSIS — Z1231 Encounter for screening mammogram for malignant neoplasm of breast: Secondary | ICD-10-CM

## 2023-05-30 ENCOUNTER — Ambulatory Visit
Admission: RE | Admit: 2023-05-30 | Discharge: 2023-05-30 | Disposition: A | Discharge status: Home / Self Care | Payer: Medicare Other | Source: Ambulatory Visit | Attending: Internal Medicine | Admitting: Internal Medicine

## 2023-05-30 DIAGNOSIS — Z1231 Encounter for screening mammogram for malignant neoplasm of breast: Secondary | ICD-10-CM | POA: Diagnosis present

## 2023-10-04 ENCOUNTER — Encounter: Payer: Self-pay | Admitting: Gastroenterology

## 2023-10-06 ENCOUNTER — Ambulatory Visit: Payer: Medicare Other | Admitting: Gastroenterology

## 2023-10-07 ENCOUNTER — Other Ambulatory Visit: Payer: Self-pay

## 2023-10-12 ENCOUNTER — Ambulatory Visit: Payer: Medicare Other | Admitting: Gastroenterology

## 2023-10-12 ENCOUNTER — Encounter: Payer: Self-pay | Admitting: Gastroenterology

## 2023-10-12 VITALS — BP 175/75 | HR 103 | Temp 97.5°F | Ht 66.0 in | Wt 124.0 lb

## 2023-10-12 DIAGNOSIS — K219 Gastro-esophageal reflux disease without esophagitis: Secondary | ICD-10-CM

## 2023-10-12 DIAGNOSIS — K641 Second degree hemorrhoids: Secondary | ICD-10-CM | POA: Diagnosis not present

## 2023-10-12 DIAGNOSIS — Z8719 Personal history of other diseases of the digestive system: Secondary | ICD-10-CM

## 2023-10-12 DIAGNOSIS — K625 Hemorrhage of anus and rectum: Secondary | ICD-10-CM

## 2023-10-12 NOTE — Progress Notes (Signed)
 PROCEDURE NOTE: The patient presents with symptomatic grade 2 hemorrhoids, unresponsive to maximal medical therapy, requesting rubber band ligation of his/her hemorrhoidal disease.  All risks, benefits and alternative forms of therapy were described and informed consent was obtained.   The decision was made to band the RP internal hemorrhoid, and the [facility] O'Regan System was used to perform band ligation without complication.  Digital anorectal examination was then performed to assure proper positioning of the band, and to adjust the banded tissue as required.  The patient was discharged home without pain or other issues.  Dietary and behavioral recommendations were given and (if necessary - prescriptions were given), along with follow-up instructions.  The patient will return 3 weeks for follow-up and possible additional banding as required.  No complications were encountered and the patient tolerated the procedure well.

## 2023-10-12 NOTE — Progress Notes (Signed)
 Arlyss Repress, MD [address]  [address]  [place], [place] [address]  Main: [phone]  Fax: [phone]    Gastroenterology Consultation  Referring Provider:     Lynnea Ferrier, MD Primary Care Physician:  Lynnea Ferrier, MD Primary Gastroenterologist:  Dr. Arlyss Repress Reason for Consultation: Symptomatic hemorrhoids, Chronic GERD        HPI:   Jody Allen is a 73 y.o. female referred by Dr. Graciela Husbands, Viona Gilmore, MD  for consultation & management of dyspepsia.  Patient states that she has been experiencing symptoms of burning in her chest, regurgitation of food, epigastric discomfort.  She reports severe heartburn at night that wakes her up from sleep associated with cough.  She had history of Nissen's fundoplication in 2016 followed by diagnosis of gastroparesis in 2017 and underwent pyloroplasty.  Since then, she has been following small frequent meals and follows healthy diet.  She also reports some abdominal bloating.  She denies any difficulty swallowing.  She is not on any PPI or H2 blocker.  She takes Mylanta as needed only.  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  Her last EGD was by Dr. Mechele Collin at [facility] after abnormal CT abdomen and pelvis in 08/2018.  It was reportedly normal.  Follow-up visit 01/12/2023 Patient is here today discuss about hemorrhoid banding.  She underwent colonoscopy and found to have large hemorrhoids.  She has been experiencing hemorrhoidal symptoms including rectal bleeding, leakage, discomfort as well as prolapse, itching  Follow-up visit 04/19/2023 Patient is here for follow-up of symptomatic hemorrhoids as well as chronic GERD.  She reports significant improvement in her hemorrhoidal symptoms.  Thrombosed external hemorrhoid has healed.  She has chronic GERD and has been taking omeprazole 40 mg twice daily for about 6 weeks and her symptoms are under control.  She had an EGD on 12/13/2022 which  revealed small hiatal hernia, fundoplication  Follow-up visit 10/12/2023 [name] on MyChart on 10/04/2023 after she had an episode of rectal bleeding after she had a bowel movement without any exertion and it was bright red blood, she had to apply pressure for few minutes in order to stop the bleeding.  She had 2 more episodes since then.  She has been noticing trace amounts of blood on wiping.  She underwent hemorrhoid ligation more than 6 months ago and it helped until recently.  NSAIDs: None  Antiplts/Anticoagulants/Anti thrombotics: None  GI Procedures:  Upper endoscopy and colonoscopy 12/03/2022 - Normal duodenal bulb and second portion of the duodenum. - A Nissen fundoplication was found. The wrap appears loose. - Erythematous mucosa in the gastric body and antrum. Biopsied. - Esophagogastric landmarks identified. - Normal gastroesophageal junction and esophagus. Biopsied. - Small hiatal hernia.  - Two 5 to 6 mm polyps in the ascending colon and in the cecum, removed with a cold snare. Resected and retrieved. - Non- bleeding external hemorrhoids.  DIAGNOSIS: A. STOMACH; COLD BIOPSY: - CHRONIC GASTRITIS WITH PATCHY ACTIVE MUCOSAL INFLAMMATION. - NEGATIVE FOR H. PYLORI, DYSPLASIA, AND MALIGNANCY.  Comment: Given the presence of active mucosal inflammation, an immunohistochemical study directed against H. pylori was performed. This study is negative for Helicobacter pylori-type organisms.  B. ESOPHAGUS; COLD BIOPSY: - BENIGN SQUAMOUS MUCOSA WITH FEATURES OF MILD REFLUX ESOPHAGITIS. - NO INCREASE IN INTRAEPITHELIAL EOSINOPHILS (LESS THAN 2 PER HPF). - NEGATIVE FOR DYSPLASIA AND MALIGNANCY.  C. COLON POLYP, CECUM; COLD  SNARE: - TUBULAR ADENOMA. - NEGATIVE FOR HIGH-GRADE DYSPLASIA AND MALIGNANCY.  D. COLON POLYP, ASCENDING; COLD SNARE: - TUBULAR ADENOMA. - NEGATIVE FOR HIGH-GRADE DYSPLASIA AND MALIGNANCY.   Upper endoscopy 09/16/2014 DIAGNOSIS:  A. STOMACH, NOS;  MUCOSAL BIOPSIES:  - CHRONIC SUPERFICIAL GASTRITIS, INACTIVE.  - NO HELICOBACTER PYLORI ORGANISMS OR DYSPLASIA SEEN.  - SEE COMMENT.   B.  ESOPHAGUS, ULCER; MUCOSAL BIOPSIES:  - ACTIVE ESOPHAGITIS WITH SUPERFICIAL EROSIONS.  - NO GLANDULAR EPITHELIUM, VIRAL INCLUSIONS, OR DYSPLASIA SEEN.  - GMS STAIN FOR FUNGAL ORGANISMS IS NEGATIVE.   Colonoscopy 12/29/2015 DIAGNOSIS: A. COLON POLYP, TRANSVERSE; HOT SNARE AND COLD BIOPSY: - TUBULAR ADENOMA, 5 FRAGMENTS. - NEGATIVE FOR HIGH-GRADE DYSPLASIA AND MALIGNANCY.  B. COLON, CECUM; COLD BIOPSY: - COLONIC MUCOSA NEGATIVE FOR MICROSCOPIC COLITIS, DYSPLASIA AND MALIGNANCY.  C. COLON, TRANSVERSE; COLD BIOPSY: - COLONIC MUCOSA NEGATIVE FOR MICROSCOPIC COLITIS, DYSPLASIA AND MALIGNANCY.  D. COLON POLYP, TRANSVERSE; HOT SNARE: - TUBULAR ADENOMA, 3 FRAGMENTS. - NEGATIVE FOR HIGH-GRADE DYSPLASIA AND MALIGNANCY.  E. COLON, DESCENDING AND SIGMOID; COLD BIOPSY: - COLONIC MUCOSA NEGATIVE FOR MICROSCOPIC COLITIS, DYSPLASIA AND MALIGNANCY.   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, nose   Depression    history of ect   Fatty liver disease, nonalcoholic    Fibrocystic breast changes    Gastroesophageal reflux disease with esophagitis 07/24/2014   GERD (gastroesophageal reflux disease)    History of hiatal hernia    Hypercholesterolemia    MVP (mitral valve prolapse)    Osteoporosis    Rosacea, acne    Shoulder ankylosis    right   Thrombocytopenia (HCC)    Vitamin D deficiency     Past Surgical History:  Procedure Laterality Date   ABDOMINAL HYSTERECTOMY     APPENDECTOMY     COLONOSCOPY     COLONOSCOPY WITH PROPOFOL N/A 12/29/2015   Procedure: COLONOSCOPY WITH PROPOFOL;  Surgeon: Scot Jun, MD;  Location[facility] ENDOSCOPY;  Service: Endoscopy;  Laterality: N/A;   COLONOSCOPY WITH PROPOFOL N/A 12/03/2022   Procedure: COLONOSCOPY WITH PROPOFOL;  Surgeon: Toney Reil,  MD;  Location: [facility] ENDOSCOPY;  Service: Gastroenterology;  Laterality: N/A;   ESOPHAGOGASTRODUODENOSCOPY     ESOPHAGOGASTRODUODENOSCOPY N/A 12/03/2022   Procedure: ESOPHAGOGASTRODUODENOSCOPY (EGD);  Surgeon: Toney Reil, MD;  Location: [facility] ENDOSCOPY;  Service: Gastroenterology;  Laterality: N/A;   ESOPHAGOGASTRODUODENOSCOPY (EGD) WITH PROPOFOL N/A 12/29/2015   Procedure: ESOPHAGOGASTRODUODENOSCOPY (EGD) WITH PROPOFOL;  Surgeon: Scot Jun, MD;  Location: [facility] ENDOSCOPY;  Service: Endoscopy;  Laterality: N/A;   FLEXIBLE SIGMOIDOSCOPY N/A 11/18/2015   Procedure: FLEXIBLE SIGMOIDOSCOPY;  Surgeon: Scot Jun, MD;  Location: [facility] ENDOSCOPY;  Service: Endoscopy;  Laterality: N/A;   HERNIA REPAIR     HIATAL HERNIA REPAIR N/A 12/19/2014   Procedure: LAPAROSCOPIC REPAIR OF HIATAL HERNIA;  Surgeon: Renda Rolls, MD;  Location: [facility] ORS;  Service: General;  Laterality: N/A;   pyeloroplasty  2017   SHOULDER SURGERY       Current Outpatient Medications:    azelastine (ASTELIN) 0.1 % nasal spray, Place 1 spray into the nose 2 (two) times daily., Disp: , Rfl:    cyanocobalamin (,VITAMIN B-12,) 1000 MCG/ML injection, Inject 1 mL into the muscle every 30 (thirty) days., Disp: , Rfl:    denosumab (PROLIA) 60 MG/ML SOSY injection, Inject 60 mg into the skin every 6 (six) months., Disp: , Rfl:    hydrocortisone 2.5 % cream, Apply 1  Application topically 2 (two) times daily., Disp: , Rfl:    ipratropium (ATROVENT) 0.06 % nasal spray, Place 2 sprays into the nose 3 (three) times daily., Disp: , Rfl:    metroNIDAZOLE (METROCREAM) 0.75 % cream, Apply 1 application  topically 2 (two) times daily., Disp: , Rfl:    omeprazole (PRILOSEC) 40 MG capsule, Take 1 capsule (40 mg total) by mouth 2 (two) times daily before a meal., Disp: 180 capsule, Rfl: 3   Vitamin D, Ergocalciferol, (DRISDOL) 50000 UNITS CAPS capsule, Take 50,000 Units by mouth every 7 (seven) days. Take on Mondays., Disp: , Rfl:     Family History  Problem Relation Age of Onset   Breast cancer Maternal Aunt 2   Breast cancer Paternal Aunt 43   Breast cancer Maternal Grandmother 45   Breast cancer Cousin 32       mat cousin     Social History   Tobacco Use   Smoking status: Never   Smokeless tobacco: Never  Vaping Use   Vaping status: Never Used  Substance Use Topics   Alcohol use: Yes    Alcohol/week: 1.0 standard drink of alcohol    Types: 1 Shots of liquor per week   Drug use: No    Allergies as of 10/12/2023 - Review Complete 10/12/2023  Allergen Reaction Noted   Phenothiazines  11/16/2015   Flagyl [metronidazole] Nausea And Vomiting 12/11/2014   Morphine and codeine Nausea And Vomiting 12/11/2014   Penicillins Hives and Rash 12/11/2014    Review of Systems:    All systems reviewed and negative except where noted in HPI.   Physical Exam:  BP (!) 175/75 (BP Location: Right Arm, Patient Position: Sitting, Cuff Size: Normal)   Pulse (!) 103   Temp (!) 97.5 F (36.4 C) (Oral)   Ht 5\' 6"  (1.676 m)   Wt 124 lb (56.2 kg)   BMI 20.01 kg/m  No LMP recorded. Patient has had a hysterectomy.  General:   Alert,  Well-developed, well-nourished, pleasant and cooperative in NAD Head:  Normocephalic and atraumatic. Eyes:  Sclera clear, no icterus.   Conjunctiva pink. Ears:  Normal auditory acuity. Nose:  No deformity, discharge, or lesions. Mouth:  No deformity or lesions,oropharynx pink & moist. Neck:  Supple; no masses or thyromegaly. Lungs:  Respirations even and unlabored.  Clear throughout to auscultation.   No wheezes, crackles, or rhonchi. No acute distress. Heart:  Regular rate and rhythm; no murmurs, clicks, rubs, or gallops. Abdomen:  Normal bowel sounds. Soft, non-tender and non-distended without masses, hepatosplenomegaly or hernias noted.  No guarding or rebound tenderness.   Rectal: Small perianal skin tag, nontender digital rectal exam Msk:  Symmetrical without gross deformities.  Good, equal movement & strength bilaterally. Pulses:  Normal pulses noted. Extremities:  No clubbing or edema.  No cyanosis. Neurologic:  Alert and oriented x3;  grossly normal neurologically. Skin:  Intact without significant lesions or rashes. No jaundice. Psych:  Alert and cooperative. Normal mood and affect.  Imaging Studies: Reviewed  Assessment and Plan:   ARIEANA SOMOZA is a 73 y.o. pleasant Caucasian female with history of osteoporosis, on Prolia, history of fundoplication in 2016 followed by diagnosis of gastroparesis, s/p pyloroplasty in 2017, history of multiple adenomatous polyps of the colon, chronic GERD, symptomatic hemorrhoids  Chronic GERD EGD in 4/24 revealed loose wrap from past history of Nissen's fundoplication Small hiatal hernia Esophageal biopsies revealed reflux esophagitis only Advised patient to continue antireflux lifestyle Currently on omeprazole 40 mg  twice daily before meals for last 8 weeks or so, decrease to 40 mg once a day before breakfast or before dinner and Pepcid as needed.  Advised her to stay on the lowest dose of omeprazole possible  Grade 2 external hemorrhoids S/p outpatient hemorrhoid ligation x 3, symptoms recurred this month, will proceed with hemorrhoid ligation   Follow up in 3 weeks   Arlyss Repress, MD

## 2023-11-07 ENCOUNTER — Encounter: Payer: Self-pay | Admitting: Gastroenterology

## 2023-11-07 ENCOUNTER — Ambulatory Visit: Payer: Medicare Other | Admitting: Gastroenterology

## 2023-11-07 ENCOUNTER — Other Ambulatory Visit: Payer: Self-pay

## 2023-11-07 VITALS — BP 170/75 | HR 86 | Temp 97.5°F | Ht 66.0 in | Wt 123.2 lb

## 2023-11-07 DIAGNOSIS — K641 Second degree hemorrhoids: Secondary | ICD-10-CM | POA: Diagnosis not present

## 2023-11-07 NOTE — Progress Notes (Signed)
 PROCEDURE NOTE: The patient presents with symptomatic grade 2 hemorrhoids, unresponsive to maximal medical therapy, requesting rubber band ligation of his/her hemorrhoidal disease.  All risks, benefits and alternative forms of therapy were described and informed consent was obtained.  The decision was made to band the LL internal hemorrhoid, and the [facility] System was used to perform band ligation without complication.  Digital anorectal examination was then performed to assure proper positioning of the band, and to adjust the banded tissue as required.  The patient was discharged home without pain or other issues.  Dietary and behavioral recommendations were given and (if necessary - prescriptions were given), along with follow-up instructions.  The patient will return 3 weeks for follow-up and possible additional banding as required.  No complications were encountered and the patient tolerated the procedure well.  She has been experiencing 6 weeks of abdominal bloating and increased bowel frequency, about 3 bowel movements daily.  She states that her initial bowel movement in the morning is pasty, then runny then watery.  Prior to this, she was having formed bowel movements only She could not pinpoint to any particular foods Advised to try lactose-free diet for the next 2 to 3 weeks, if symptoms are persistent, recommend H. pylori breath test

## 2023-12-06 ENCOUNTER — Encounter: Payer: Self-pay | Admitting: Gastroenterology

## 2023-12-06 ENCOUNTER — Ambulatory Visit: Admitting: Gastroenterology

## 2023-12-06 VITALS — BP 160/80 | HR 76 | Temp 97.7°F | Ht 66.0 in | Wt 122.5 lb

## 2023-12-06 DIAGNOSIS — K641 Second degree hemorrhoids: Secondary | ICD-10-CM

## 2023-12-06 NOTE — Progress Notes (Signed)
 PROCEDURE NOTE: The patient presents with symptomatic grade 2 hemorrhoids, unresponsive to maximal medical therapy, requesting rubber band ligation of his/her hemorrhoidal disease.  All risks, benefits and alternative forms of therapy were described and informed consent was obtained.  The decision was made to band the RA internal hemorrhoid, and the [facility] System was used to perform band ligation without complication.  Digital anorectal examination was then performed to assure proper positioning of the band, and to adjust the banded tissue as required.  The patient was discharged home without pain or other issues.  Dietary and behavioral recommendations were given and (if necessary - prescriptions were given), along with follow-up instructions.  The patient will return as needed for follow-up and possible additional banding as required.  No complications were encountered and the patient tolerated the procedure well.

## 2024-02-15 ENCOUNTER — Encounter: Payer: Self-pay | Admitting: Gastroenterology

## 2024-03-02 ENCOUNTER — Encounter: Payer: Self-pay | Admitting: Advanced Practice Midwife

## 2024-04-27 ENCOUNTER — Other Ambulatory Visit: Payer: Self-pay | Admitting: Internal Medicine

## 2024-04-27 DIAGNOSIS — Z1231 Encounter for screening mammogram for malignant neoplasm of breast: Secondary | ICD-10-CM

## 2024-05-30 ENCOUNTER — Ambulatory Visit
Admission: RE | Admit: 2024-05-30 | Discharge: 2024-05-30 | Disposition: A | Discharge status: Home / Self Care | Source: Ambulatory Visit | Attending: Internal Medicine | Admitting: Internal Medicine

## 2024-05-30 DIAGNOSIS — Z1231 Encounter for screening mammogram for malignant neoplasm of breast: Secondary | ICD-10-CM | POA: Diagnosis present

## 2024-06-21 ENCOUNTER — Encounter: Payer: Self-pay | Admitting: Gastroenterology

## 2024-06-25 ENCOUNTER — Ambulatory Visit: Payer: Self-pay | Admitting: General Practice

## 2024-06-25 ENCOUNTER — Encounter
Admission: RE | Disposition: A | Discharge status: Home / Self Care | Payer: Self-pay | Source: Home / Self Care | Attending: Gastroenterology

## 2024-06-25 ENCOUNTER — Other Ambulatory Visit: Payer: Self-pay

## 2024-06-25 ENCOUNTER — Encounter: Payer: Self-pay | Admitting: Gastroenterology

## 2024-06-25 ENCOUNTER — Ambulatory Visit
Admission: RE | Admit: 2024-06-25 | Discharge: 2024-06-25 | Disposition: A | Discharge status: Home / Self Care | Attending: Gastroenterology | Admitting: Gastroenterology

## 2024-06-25 DIAGNOSIS — K219 Gastro-esophageal reflux disease without esophagitis: Secondary | ICD-10-CM | POA: Diagnosis not present

## 2024-06-25 DIAGNOSIS — R197 Diarrhea, unspecified: Secondary | ICD-10-CM | POA: Diagnosis present

## 2024-06-25 HISTORY — PX: FLEXIBLE SIGMOIDOSCOPY: SHX5431

## 2024-06-25 HISTORY — DX: Presence of external hearing-aid: Z97.4

## 2024-06-25 SURGERY — SIGMOIDOSCOPY, FLEXIBLE
Anesthesia: General | Site: Rectum

## 2024-06-25 MED ORDER — STERILE WATER FOR IRRIGATION IR SOLN
Status: DC | PRN
  Administered 2024-06-25: 1

## 2024-06-25 MED ORDER — PROPOFOL 10 MG/ML IV BOLUS
INTRAVENOUS | Status: DC | PRN
  Administered 2024-06-25: 100 mg via INTRAVENOUS

## 2024-06-25 MED ORDER — LACTATED RINGERS IV SOLN: INTRAVENOUS | Status: DC

## 2024-06-25 SURGICAL SUPPLY — 16 items
CLIP HMST 235XBRD CATH ROT (MISCELLANEOUS) IMPLANT
ELECTRODE REM PT RTRN 9FT ADLT (ELECTROSURGICAL) IMPLANT
FORCEPS BIOP RAD 4 LRG CAP 4 (CUTTING FORCEPS) IMPLANT
FORCEPS ESCP3.2XJMB 240X2.8X (MISCELLANEOUS) IMPLANT
GAUZE SPONGE 4X4 12PLY STRL (GAUZE/BANDAGES/DRESSINGS) IMPLANT
GOWN CVR UNV OPN BCK APRN NK (MISCELLANEOUS) ×4 IMPLANT
INJECTOR VARIJECT VIN23 (MISCELLANEOUS) IMPLANT
KIT DEFENDO VALVE AND CONN (KITS) IMPLANT
KIT PROCEDURE OLYMPUS (MISCELLANEOUS) ×2 IMPLANT
MANIFOLD NEPTUNE II (INSTRUMENTS) ×2 IMPLANT
MARKER SPOT ENDO TATTOO 5ML (MISCELLANEOUS) IMPLANT
PROBE APC STR FIRE (PROBE) IMPLANT
RETRIEVER NET ROTH 2.5X230 LF (MISCELLANEOUS) IMPLANT
SNARE COLD EXACTO (MISCELLANEOUS) IMPLANT
TRAP ETRAP POLY (MISCELLANEOUS) IMPLANT
WATER STERILE IRR 250ML POUR (IV SOLUTION) ×2 IMPLANT

## 2024-06-25 NOTE — Anesthesia Postprocedure Evaluation (Signed)
 Anesthesia Post Note  Patient: Jody Allen  Procedure(s) Performed: [place]ST[place] SIDE (Rectum)  Patient location during evaluation: PACU Anesthesia Type: General Level of consciousness: awake and alert Pain management: pain level controlled Vital Signs Assessment: post-procedure vital signs reviewed and stable Respiratory status: spontaneous breathing, nonlabored ventilation, respiratory function stable and patient connected to nasal cannula oxygen Cardiovascular status: blood pressure returned to baseline and stable Postop Assessment: no apparent nausea or vomiting Anesthetic complications: no   No notable events documented.   Last Vitals:  Vitals:   06/25/24 1100 06/25/24 1101  BP:  137/68  Pulse: 69   Resp: (!) 24   Temp:    SpO2: 98%     Last Pain:  Vitals:   06/25/24 1100  TempSrc:   PainSc: 0-No pain                 Jamear Carbonneau C Peace Jost

## 2024-06-25 NOTE — Op Note (Signed)
 [facility] Gastroenterology Patient Name: Jody Allen Procedure Date: 06/25/2024 10:23 AM MRN: [id number] Account #: [id number] Date of Birth: 11-29-1950 Admit Type: Outpatient Age: 73 Room: [facility] OR ROOM 01 Gender: Female Note Status: Finalized Instrument Name: Endoscope [id number] Procedure:             Flexible Sigmoidoscopy Indications:           Clinically significant diarrhea of unexplained origin Providers:             Corinn Jess Brooklyn MD, MD Referring MD:          Ophelia Sage, MD (Referring MD) Medicines:             General Anesthesia Complications:         No immediate complications. Estimated blood loss: None. Procedure:             Pre-Anesthesia Assessment:                        - Prior to the procedure, a History and Physical was                         performed, and patient medications and allergies were                         reviewed. The patient is competent. The risks and                         benefits of the procedure and the sedation options and                         risks were discussed with the patient. All questions                         were answered and informed consent was obtained.                         Patient identification and proposed procedure were                         verified by the physician, the nurse, the                         anesthesiologist, the anesthetist and the technician                         in the pre-procedure area in the procedure room in the                         endoscopy suite. Mental Status Examination: alert and                         oriented. Airway Examination: normal oropharyngeal                         airway and neck mobility. Respiratory Examination:                         clear to auscultation. CV Examination: normal.  Prophylactic Antibiotics: The patient does not require                         prophylactic antibiotics. Prior Anticoagulants: The                          patient has taken no anticoagulant or antiplatelet                         agents. ASA Grade Assessment: III - A patient with                         severe systemic disease. After reviewing the risks and                         benefits, the patient was deemed in satisfactory                         condition to undergo the procedure. The anesthesia                         plan was to use general anesthesia. Immediately prior                         to administration of medications, the patient was                         re-assessed for adequacy to receive sedatives. The                         heart rate, respiratory rate, oxygen saturations,                         blood pressure, adequacy of pulmonary ventilation, and                         response to care were monitored throughout the                         procedure. The physical status of the patient was                         re-assessed after the procedure.                        After obtaining informed consent, the scope was passed                         under direct vision. The Endoscope was introduced                         through the anus and advanced to the the descending                         colon. The flexible sigmoidoscopy was accomplished                         without difficulty. The patient tolerated the  procedure well. The quality of the bowel preparation                         was fair. Findings:      The perianal and digital rectal examinations were normal. Pertinent       negatives include normal sphincter tone and no palpable rectal lesions.      The entire examined colon appeared normal. Biopsies were taken with a       cold forceps for histology.      Few small post banding scars were found in the distal rectum. The scar       tissue was healthy in appearance. Impression:            - Preparation of the colon was fair.                        - The entire  examined colon is normal. Biopsied. Recommendation:        - Discharge patient to home (with escort).                        - Resume previous diet today.                        - Await pathology results. Procedure Code(s):     --- Professional ---                        [phone], Sigmoidoscopy, flexible; with biopsy, single or                         multiple Diagnosis Code(s):     --- Professional ---                        R19.7, Diarrhea, unspecified CPT copyright 2022 [employer]. All rights reserved. The codes documented in this report are preliminary and upon coder review may  be revised to meet current compliance requirements. Dr. Corinn Brooklyn Corinn Jess Brooklyn MD, MD 06/25/2024 10:49:39 AM This report has been signed electronically. Number of Addenda: 0 Note Initiated On: 06/25/2024 10:23 AM Total Procedure Duration: 0 hours 6 minutes 19 seconds  Estimated Blood Loss:  Estimated blood loss: none.      [facility]

## 2024-06-25 NOTE — H&P (Signed)
 Corinn JONELLE Brooklyn, MD [facility], DHIP [address]  [place], [place] [address]  Main: [phone] Fax:  [phone] Pager: [phone]   Primary Care Physician:  Fernande Ophelia JINNY DOUGLAS, MD Primary Gastroenterologist:  Dr. Corinn JONELLE Brooklyn  Pre-Procedure History & Physical: HPI:  Jody Allen is a 73 y.o. female is here for an flexible sigmoidoscopy.   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, nose   Depression    history of ect   Fatty liver disease, nonalcoholic    Fibrocystic breast changes    Gastroesophageal reflux disease with esophagitis 07/24/2014   GERD (gastroesophageal reflux disease)    Hearing aid worn    History of hiatal hernia    Hypercholesterolemia    MVP (mitral valve prolapse)    Osteoporosis    Rosacea, acne    Shoulder ankylosis    right   Thrombocytopenia    Vitamin D  deficiency     Past Surgical History:  Procedure Laterality Date   ABDOMINAL HYSTERECTOMY     APPENDECTOMY     COLONOSCOPY     COLONOSCOPY WITH PROPOFOL  N/A 12/29/2015   Procedure: COLONOSCOPY WITH PROPOFOL ;  Surgeon: Lamar ONEIDA Holmes, MD;  Location: [facility] ENDOSCOPY;  Service: Endoscopy;  Laterality: N/A;   COLONOSCOPY WITH PROPOFOL  N/A 12/03/2022   Procedure: COLONOSCOPY WITH PROPOFOL ;  Surgeon: Brooklyn Corinn Skiff, MD;  Location: [facility] ENDOSCOPY;  Service: Gastroenterology;  Laterality: N/A;   ESOPHAGOGASTRODUODENOSCOPY     ESOPHAGOGASTRODUODENOSCOPY N/A 12/03/2022   Procedure: ESOPHAGOGASTRODUODENOSCOPY (EGD);  Surgeon: Brooklyn Corinn Skiff, MD;  Location: [facility] ENDOSCOPY;  Service: Gastroenterology;  Laterality: N/A;   ESOPHAGOGASTRODUODENOSCOPY (EGD) WITH PROPOFOL  N/A 12/29/2015   Procedure: ESOPHAGOGASTRODUODENOSCOPY (EGD) WITH PROPOFOL ;  Surgeon: Lamar ONEIDA Holmes, MD;  Location: [facility] ENDOSCOPY;  Service: Endoscopy;  Laterality: N/A;   FLEXIBLE SIGMOIDOSCOPY N/A 11/18/2015   Procedure: FLEXIBLE  SIGMOIDOSCOPY;  Surgeon: Lamar ONEIDA Holmes, MD;  Location: [facility] ENDOSCOPY;  Service: Endoscopy;  Laterality: N/A;   HERNIA REPAIR     HIATAL HERNIA REPAIR N/A 12/19/2014   Procedure: LAPAROSCOPIC REPAIR OF HIATAL HERNIA;  Surgeon: Unknown Sharps, MD;  Location: [facility] ORS;  Service: General;  Laterality: N/A;   pyeloroplasty  2017   SHOULDER SURGERY      Prior to Admission medications   Medication Sig Start Date End Date Taking? Authorizing Provider  denosumab-bbdz (JUBBONTI) 60 MG/ML SOSY injection Inject 60 mg into the skin every 6 (six) months.   Yes [provider]  doxycycline (ADOXA) 50 MG tablet Take 20 mg by mouth 2 (two) times daily.   Yes [provider]  cyanocobalamin  (,VITAMIN B-12,) 1000 MCG/ML injection Inject 1 mL into the muscle every 30 (thirty) days. 06/19/15   [provider]  denosumab (PROLIA) 60 MG/ML SOSY injection Inject 60 mg into the skin every 6 (six) months. Patient not taking: Reported on 06/21/2024    [provider]  hydrocortisone 2.5 % cream Apply 1 Application topically 2 (two) times daily. 06/08/22   [provider]  ipratropium (ATROVENT) 0.06 % nasal spray Place 2 sprays into the nose 3 (three) times daily.    [provider]  metroNIDAZOLE  (METROCREAM ) 0.75 % cream Apply 1 application  topically 2 (two) times daily.    [provider]  omeprazole  (PRILOSEC) 40 MG capsule Take 1 capsule (40 mg total) by mouth 2 (two) times daily before a meal. 04/06/23 03/31/24  Unk Corinn Skiff, MD  Vitamin D , Ergocalciferol , (DRISDOL ) 50000 UNITS CAPS capsule Take 50,000 Units by mouth every 7 (seven) days. Take on Mondays.    [provider]    Allergies as of 06/20/2024 - Review Complete 12/06/2023  Allergen Reaction Noted   Phenothiazines  11/16/2015   Flagyl  [metronidazole ] Nausea And Vomiting 12/11/2014   Morphine  and codeine Nausea And Vomiting 12/11/2014   Penicillins Hives and Rash 12/11/2014     Family History  Problem Relation Age of Onset   Breast cancer Maternal Aunt 38   Breast cancer Paternal Aunt 48   Breast cancer Maternal Grandmother 45   Breast cancer Cousin 32       mat cousin    Social History   Socioeconomic History   Marital status: Married    Spouse name: Not on file   Number of children: Not on file   Years of education: Not on file   Highest education level: Not on file  Occupational History   Not on file  Tobacco Use   Smoking status: Never   Smokeless tobacco: Never  Vaping Use   Vaping status: Never Used  Substance and Sexual Activity   Alcohol use: Yes    Alcohol/week: 1.0 - 2.0 standard drink of alcohol    Types: 1 - 2 Shots of liquor per week   Drug use: No   Sexual activity: Never  Other Topics Concern   Not on file  Social History Narrative   Not on file   Social Drivers of Health   Financial Resource Strain: Low Risk  (04/15/2024)   Received from [facility]   Overall Financial Resource Strain (CARDIA)    Difficulty of Paying Living Expenses: Not hard at all  Food Insecurity: No Food Insecurity (04/15/2024)   Received from [facility]   Hunger Vital Sign    Within the past 12 months, you worried that your food would run out before you got the money to buy more.: Never true    Within the past 12 months, the food you bought just didn't last and you didn't have money to get more.: Never true  Transportation Needs: No Transportation Needs (04/15/2024)   Received from [facility] - Transportation    In the past 12 months, has lack of transportation kept you from medical appointments or from getting medications?: No    Lack of Transportation (Non-Medical): No  Physical Activity: Not on file  Stress: Not on file  Social Connections: Not on file  Intimate Partner Violence: Not on file    Review of Systems: See HPI, otherwise negative ROS  Physical Exam: BP (!)  164/50   Pulse 80   Temp (!) 97.5 F (36.4 C) (Temporal)   Resp 16   Ht 5' 5 (1.651 m)   Wt 56.7 kg   SpO2 100%   BMI 20.80 kg/m  General:   Alert,  pleasant and cooperative in NAD Head:  Normocephalic and atraumatic. Neck:  Supple; no masses or thyromegaly. Lungs:  Clear throughout to auscultation.    Heart:  Regular rate and rhythm. Abdomen:  Soft, nontender and nondistended. Normal bowel sounds, without guarding, and without rebound.   Neurologic:  Alert and  oriented x4;  grossly normal neurologically.  Impression/Plan: Jody Allen is here for an flexible sigmoidoscopy to be performed for Chronic bloating and loose stools   Risks, benefits, limitations, and alternatives regarding  flexible sigmoidoscopy have been  reviewed with the patient.  Questions have been answered.  All parties agreeable.   Corinn Brooklyn, MD  06/25/2024, 10:23 AM

## 2024-06-25 NOTE — Anesthesia Preprocedure Evaluation (Signed)
 Anesthesia Evaluation  Patient identified by MRN, date of birth, ID band Patient awake    Reviewed: Allergy & Precautions, H&P , NPO status , Patient's Chart, lab work & pertinent test results  Airway Mallampati: II  TM Distance: <3 FB Neck ROM: Full    Dental no notable dental hx.  Has perm bridge:   Pulmonary neg pulmonary ROS   Pulmonary exam normal breath sounds clear to auscultation       Cardiovascular negative cardio ROS Normal cardiovascular exam Rhythm:Regular Rate:Normal     Neuro/Psych  PSYCHIATRIC DISORDERS  Depression    negative neurological ROS  negative psych ROS   GI/Hepatic negative GI ROS, Neg liver ROS, hiatal hernia,GERD  ,,  Endo/Other  negative endocrine ROS    Renal/GU negative Renal ROS  negative genitourinary   Musculoskeletal negative musculoskeletal ROS (+)    Abdominal   Peds negative pediatric ROS (+)  Hematology negative hematology ROS (+)   Anesthesia Other Findings Medical History  GERD (gastroesophageal reflux disease) History of hiatal hernia Depression  Rosacea, acne MVP (mitral valve prolapse)  Fatty liver disease, nonalcoholic Shoulder ankylosis  B12 deficiency Fibrocystic breast changes  Osteoporosis Hypercholesterolemia  Thrombocytopenia Vitamin D  deficiency  Cancer (HCC) Gastroesophageal reflux disease with esophagitis Hearing aid worn    Reproductive/Obstetrics negative OB ROS                              Anesthesia Physical Anesthesia Plan  ASA: 3  Anesthesia Plan: General   Post-op Pain Management:    Induction: Intravenous  PONV Risk Score and Plan:   Airway Management Planned: Natural Airway and Nasal Cannula  Additional Equipment:   Intra-op Plan:   Post-operative Plan:   Informed Consent: I have reviewed the patients History and Physical, chart, labs and discussed the procedure including the risks, benefits and  alternatives for the proposed anesthesia with the patient or authorized representative who has indicated his/her understanding and acceptance.     Dental Advisory Given  Plan Discussed with: Anesthesiologist, CRNA and Surgeon  Anesthesia Plan Comments: (Patient consented for risks of anesthesia including but not limited to:  - adverse reactions to medications - risk of airway placement if required - damage to eyes, teeth, lips or other oral mucosa - nerve damage due to positioning  - sore throat or hoarseness - Damage to heart, brain, nerves, lungs, other parts of body or loss of life  Patient voiced understanding and assent.)         Anesthesia Quick Evaluation

## 2024-06-25 NOTE — Transfer of Care (Signed)
 Immediate Anesthesia Transfer of Care Note  Patient: Jody Allen  Procedure(s) Performed: [place] SIDE (Rectum)  Patient Location: PACU  Anesthesia Type: General  Level of Consciousness: awake, alert  and patient cooperative  Airway and Oxygen Therapy: Patient Spontanous Breathing   Post-op Assessment: Post-op Vital signs reviewed, Patient's Cardiovascular Status Stable, Respiratory Function Stable, Patent Airway and No signs of Nausea or vomiting  Post-op Vital Signs: Reviewed and stable  Complications: No notable events documented.

## 2024-06-27 LAB — SURGICAL PATHOLOGY

## 2024-06-28 ENCOUNTER — Ambulatory Visit: Payer: Self-pay | Admitting: Gastroenterology
